# Patient Record
Sex: Female | Born: 1985
Health system: Southern US, Community
[De-identification: ages and names within clinical notes are randomized; demographics above are authoritative.]

---

## 2015-10-25 IMAGING — DX DG LUMBAR SPINE 2-3V
2 series · 2 of 2 positions shown · non-contrast
Comparison: None.

CLINICAL DATA: Lumbago

EXAM:
LUMBAR SPINE - 2-3 VIEW

[l-spine ap]
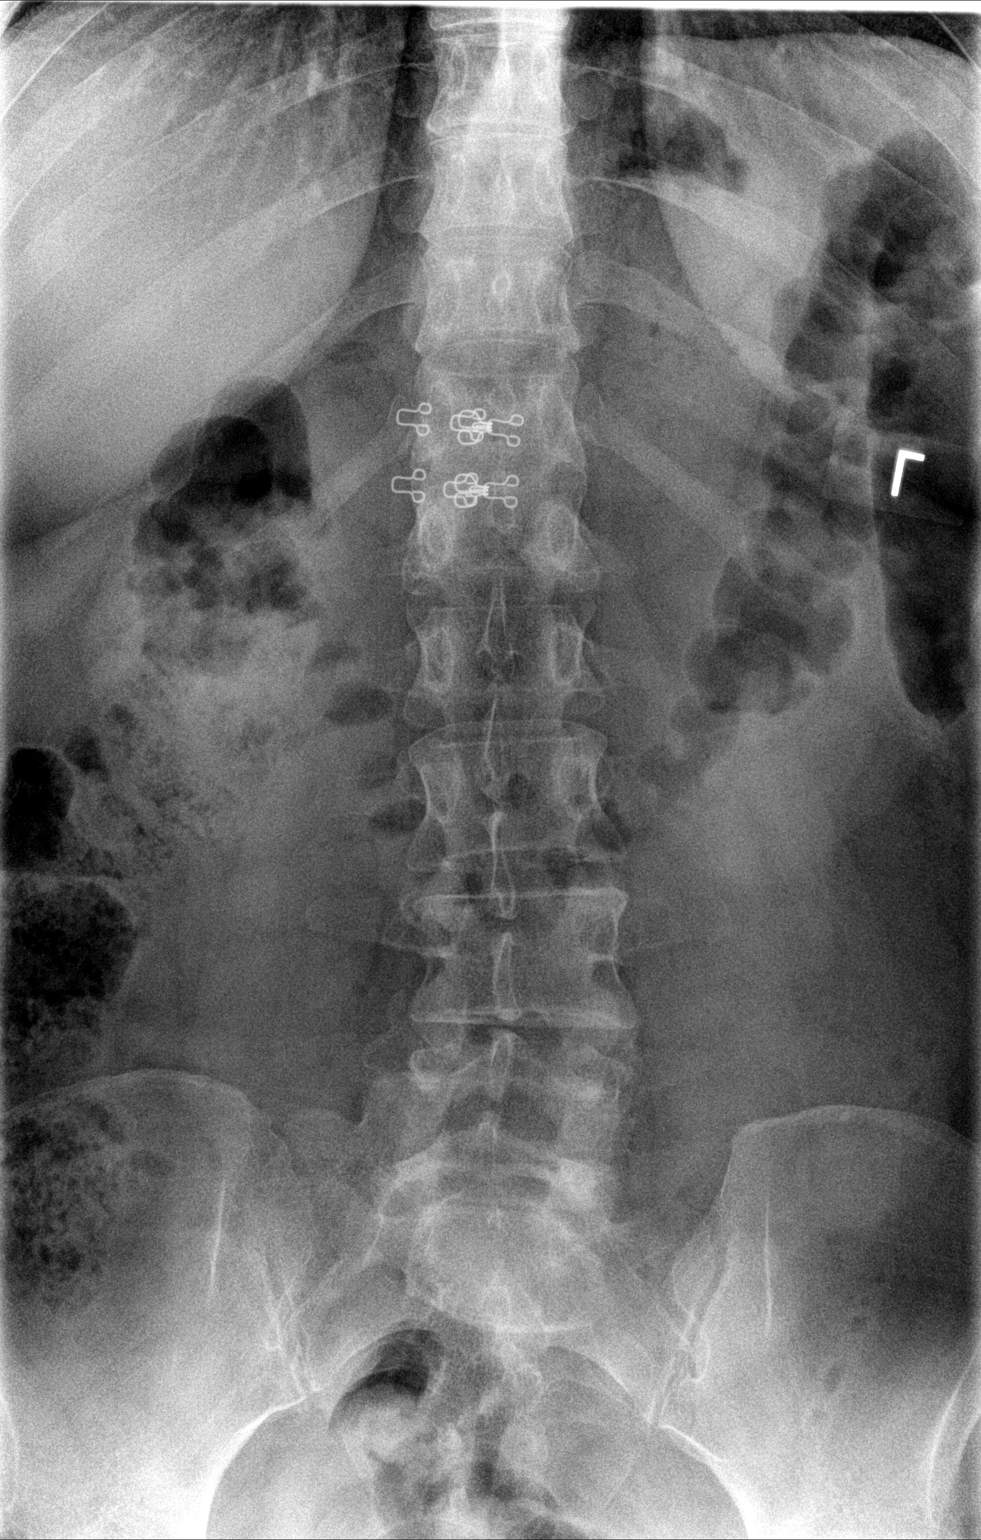

[l-spine lat]
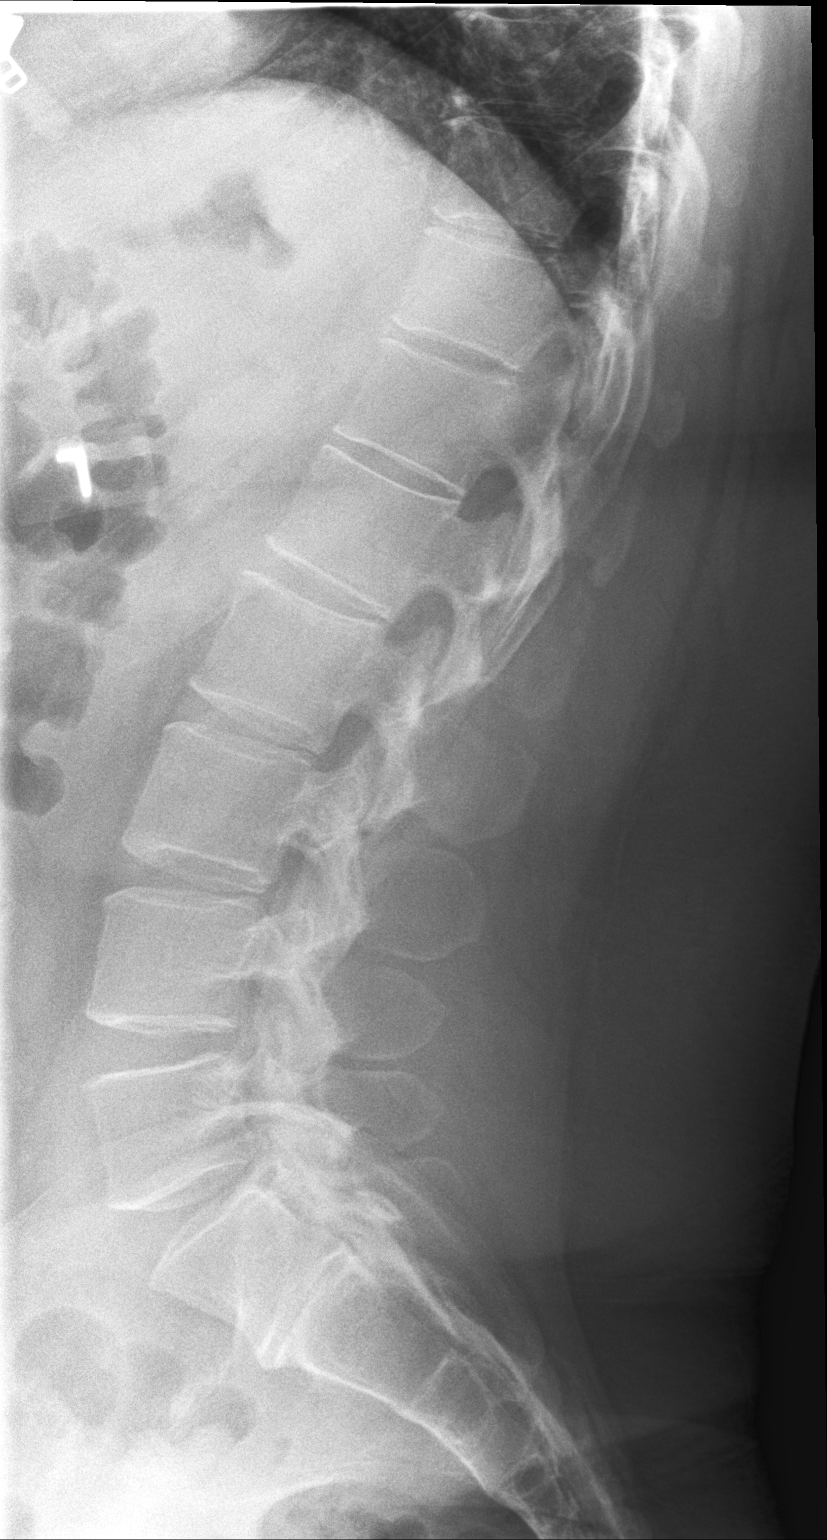

[2 of 2 positions shown; findings below may reference images not displayed]

FINDINGS: Frontal and lateral views were obtained. There are 5 non-rib-bearing
lumbar type vertebral bodies. There is lower lumbar levoscoliosis.
There is no fracture or spondylolisthesis. The disc spaces appear
unremarkable. No erosive change.
IMPRESSION: Lower lumbar levoscoliosis. No fracture or spondylolisthesis. No
appreciable disc space narrowing.

## 2017-08-25 MED FILL — SPIRONOLACTONE 50 MG TAB: 50 | 30 days supply | Qty: 30 | Fill #0

## 2017-08-25 MED FILL — ATORVASTATIN 40 MG TABLET: 40 | 90 days supply | Qty: 90 | Fill #0

## 2017-08-25 MED FILL — PANTOPRAZOLE SOD DR 40 MG T: 40 | 90 days supply | Qty: 90 | Fill #0

## 2017-09-29 MED FILL — SPIRONOLACTONE 50 MG TABLET: 50 | 30 days supply | Qty: 30 | Fill #1

## 2018-01-11 MED FILL — PANTOPRAZOLE SOD DR 40 MG T: 40 | 60 days supply | Qty: 60 | Fill #1

## 2018-01-11 MED FILL — ATORVASTATIN 40 MG TABLET: 40 | 90 days supply | Qty: 90 | Fill #1

## 2018-01-11 MED FILL — SPIRONOLACTONE 50 MG TABS: 50 | 30 days supply | Qty: 30 | Fill #2

## 2018-05-30 MED FILL — SPIRONOLACTONE 100 MG TAB: 100 | 90 days supply | Qty: 90 | Fill #0

## 2018-05-30 MED FILL — PANTOPRAZOLE SOD DR 40 MG T: 40 | 90 days supply | Qty: 90 | Fill #0

## 2018-05-30 MED FILL — TOPIRAMATE 50 MG TABLET: 50 | 41 days supply | Qty: 120 | Fill #0

## 2018-07-18 MED FILL — TOPIRAMATE 200 MG TABLET: 200 | 90 days supply | Qty: 90 | Fill #0

## 2018-07-27 MED FILL — TOPIRAMATE 200 MG TABLET: 200 | 90 days supply | Qty: 90 | Fill #0

## 2018-11-09 MED FILL — AMOXICILLIN 500 MG CAPSULE: 500 | 10 days supply | Qty: 20 | Fill #0

## 2018-11-09 MED FILL — FLUTICASONE PROP 50 MCG SPR: 50 | 30 days supply | Qty: 16 | Fill #0

## 2019-10-14 IMAGING — DX DG CHEST 2V
3 series · 3 of 3 positions shown · non-contrast
Comparison: None.

CLINICAL DATA: Pt received her second covid vaccine yesterday and
this morning began to have right groin pain. Then later began to
have chills and right leg pain. Redness noted to right lower leg
which is new.

EXAM:
CHEST - 2 VIEW

[chest lat (1 of 2)]
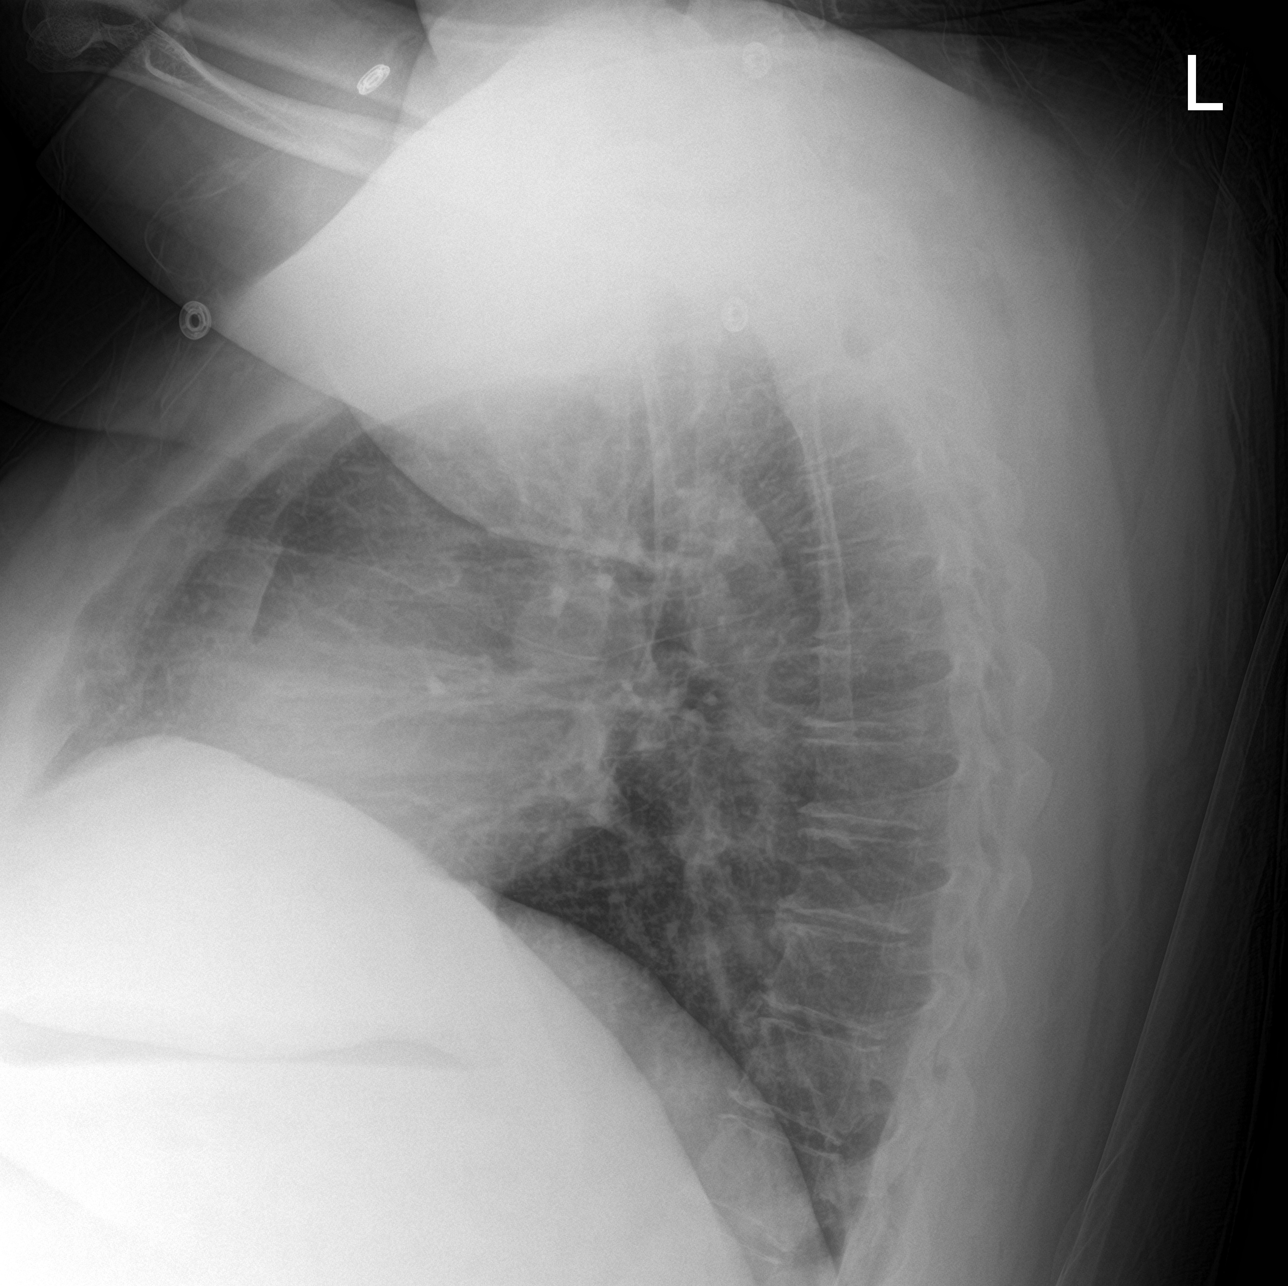

[chest ap]
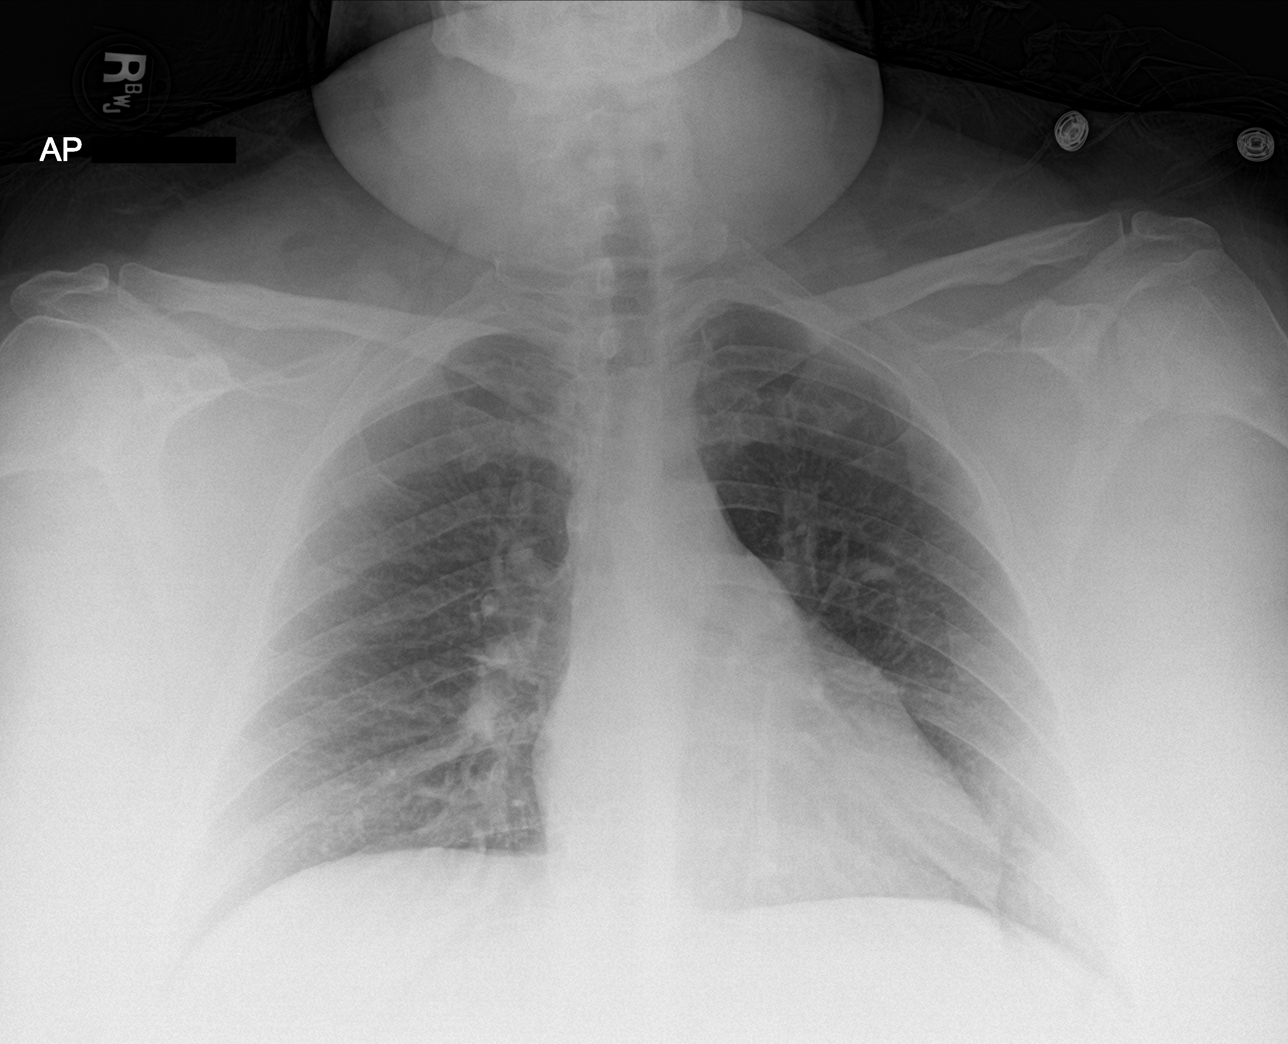

[chest lat (2 of 2)]
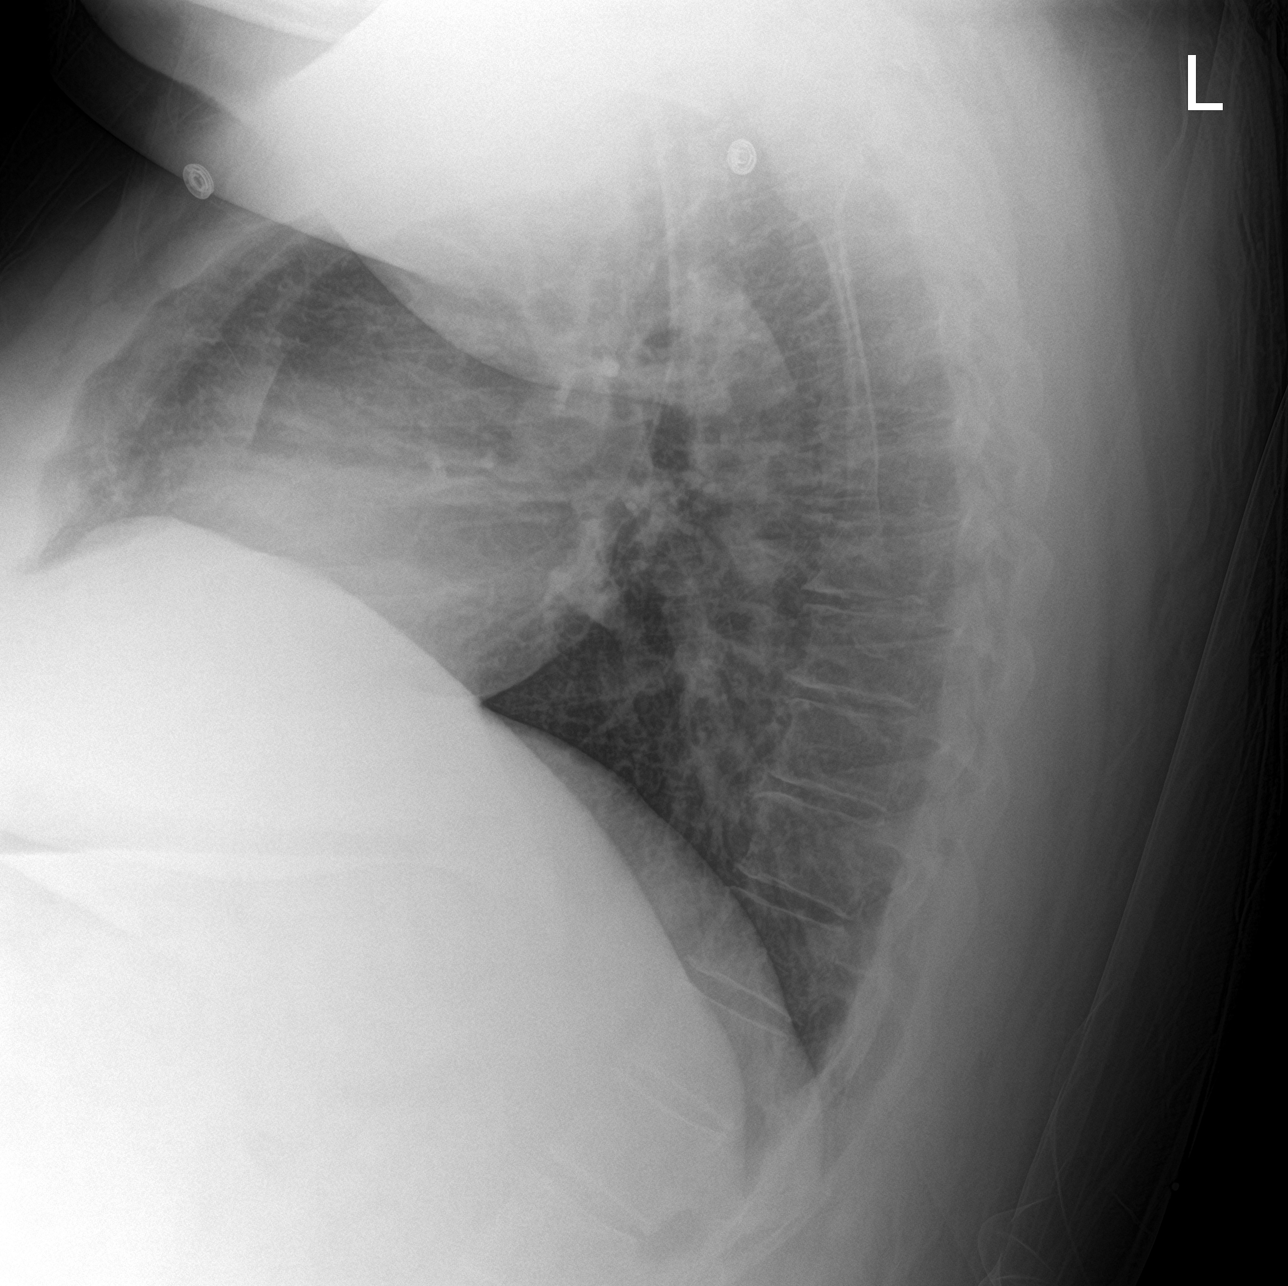

[3 of 3 positions shown; findings below may reference images not displayed]

FINDINGS: The cardiomediastinal contours are within normal limits. The lungs
are clear. No pneumothorax or pleural effusion. No acute finding in
the visualized skeleton.
IMPRESSION: No acute cardiopulmonary process.

## 2019-10-15 IMAGING — US US ABDOMEN COMPLETE
1 series · 14 of 25 positions shown · non-contrast
Comparison: None.

CLINICAL DATA: Transaminitis

EXAM:
ABDOMEN ULTRASOUND COMPLETE

[Series 1: us abdomen complete · 14 of 85 slices shown]
[im 1/85]
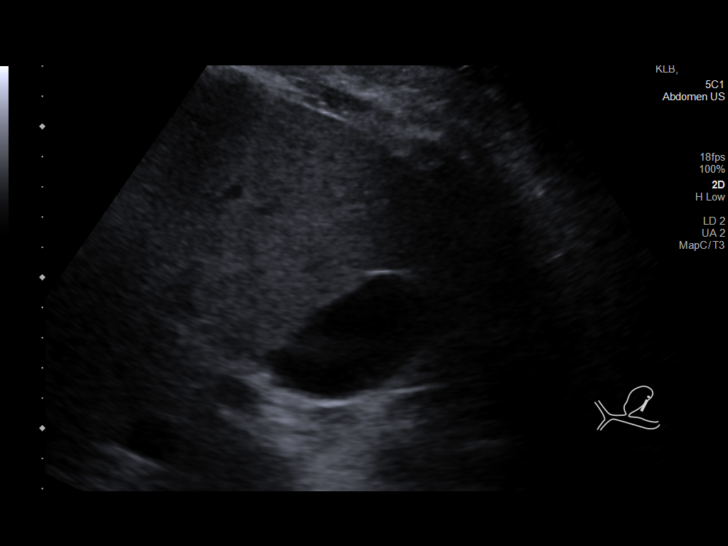
[im 8/85]
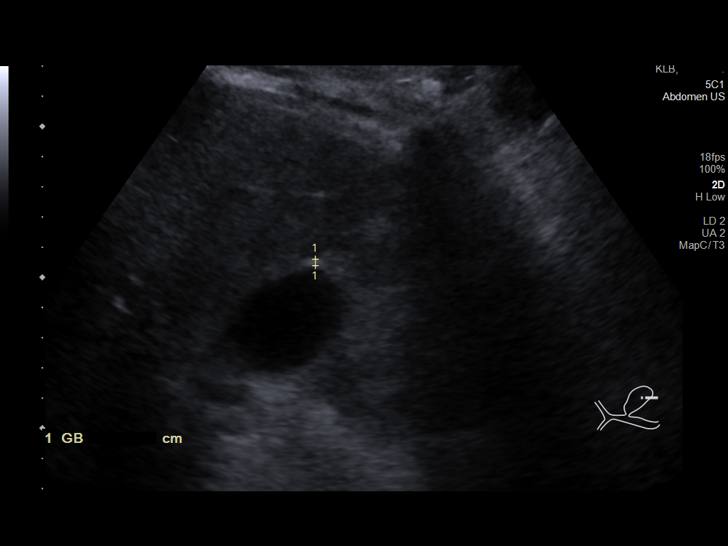
[im 15/85]
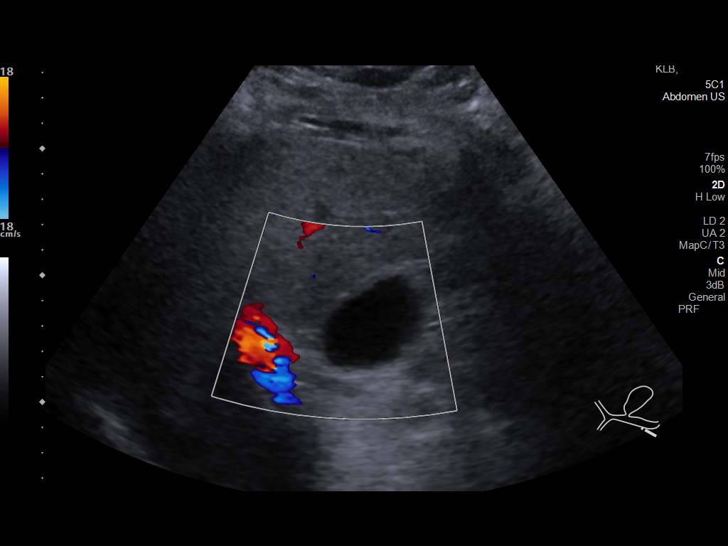
[im 22/85]
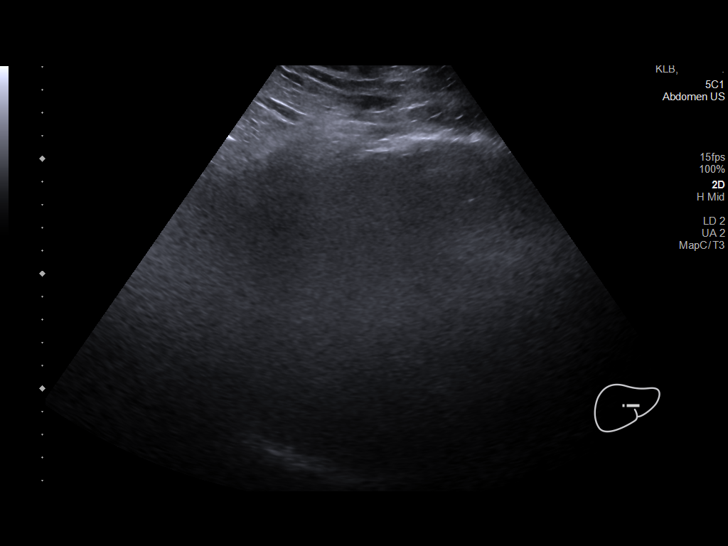
[im 29/85]
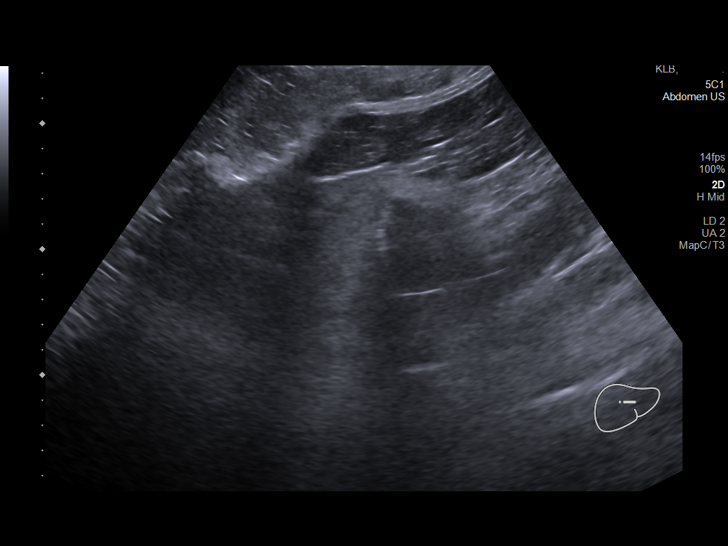
[im 32/85]
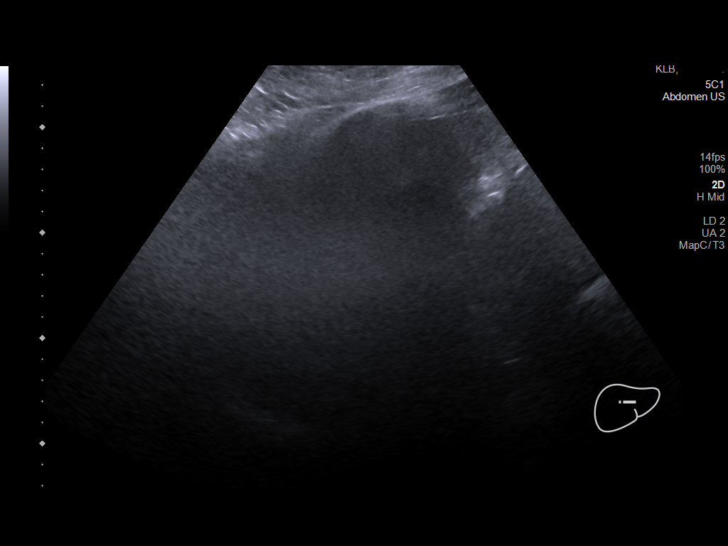
[im 39/85]
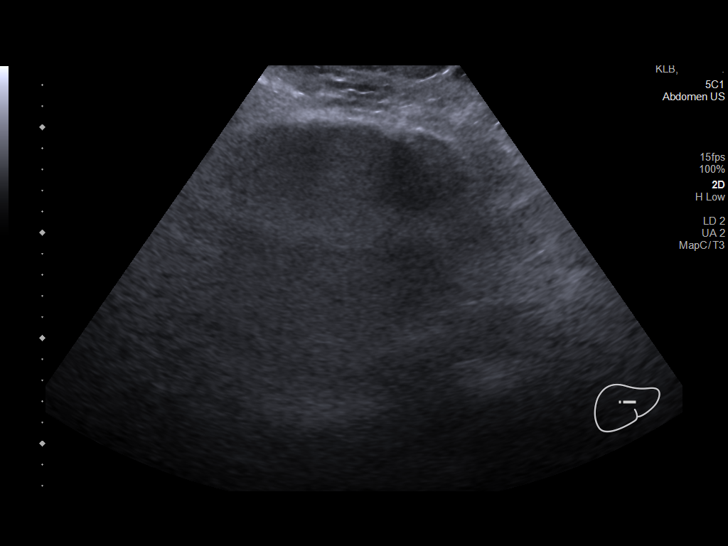
[im 46/85]
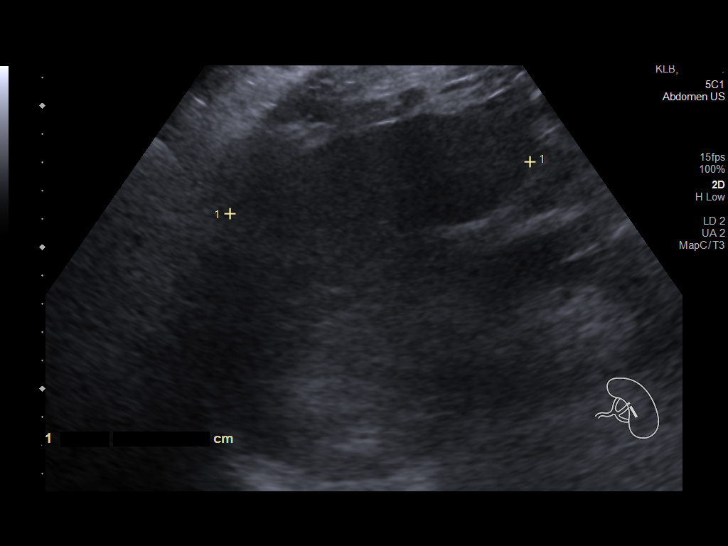
[im 53/85]
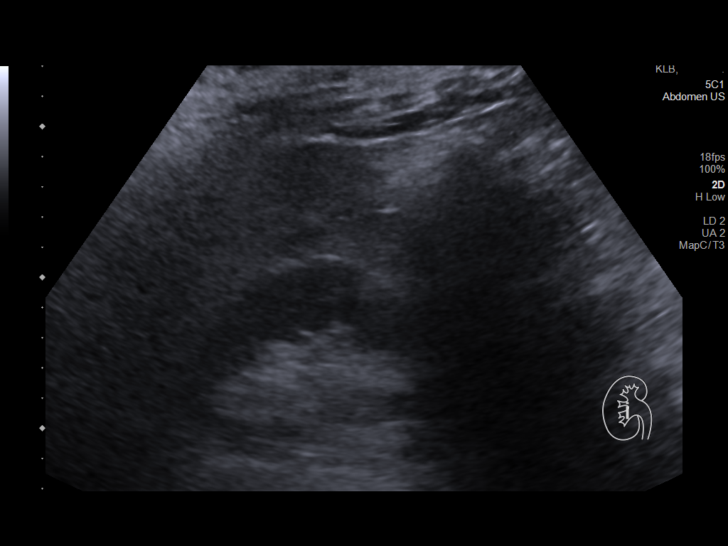
[im 57/85]
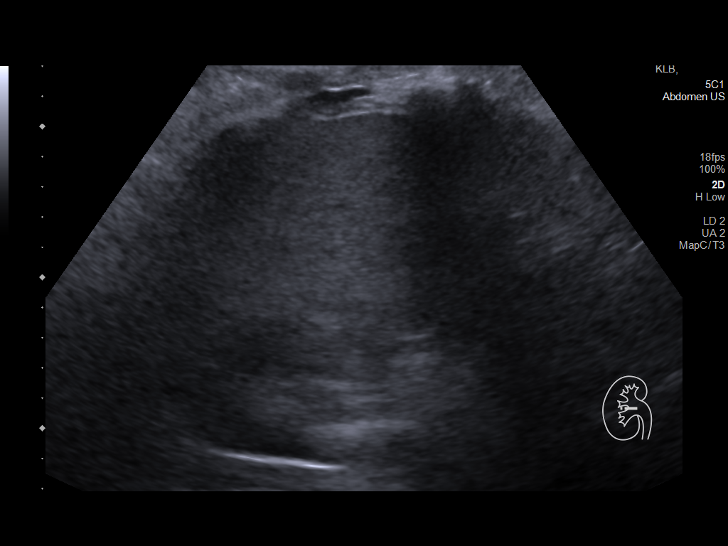
[im 64/85]
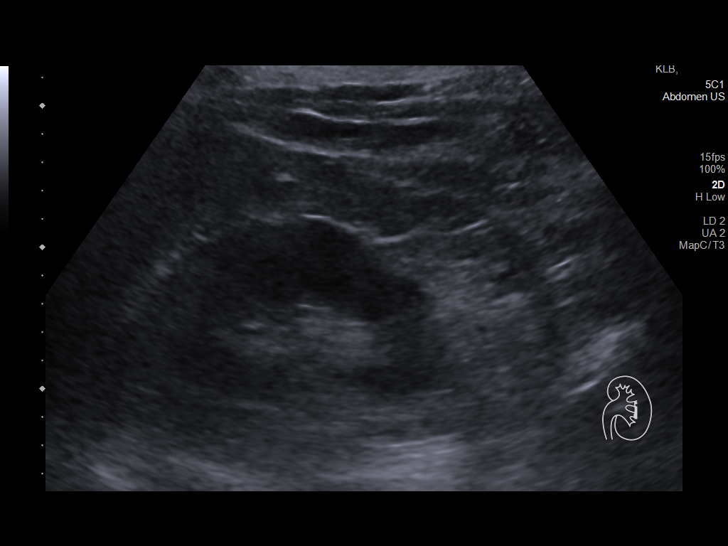
[im 71/85]
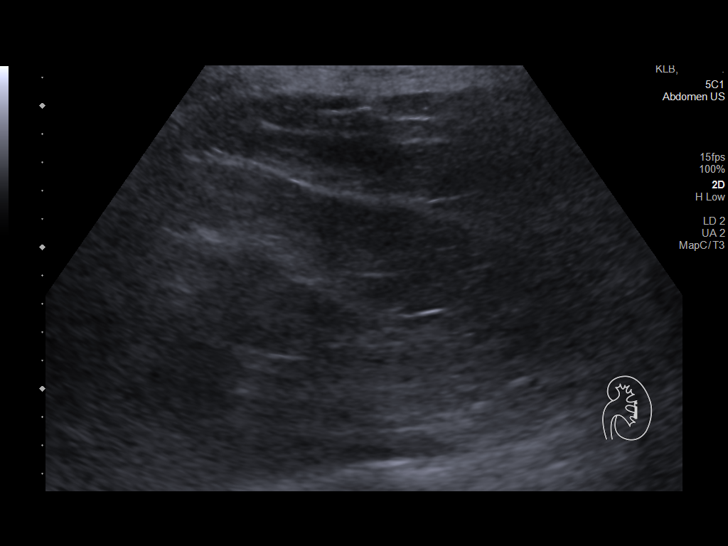
[im 78/85]
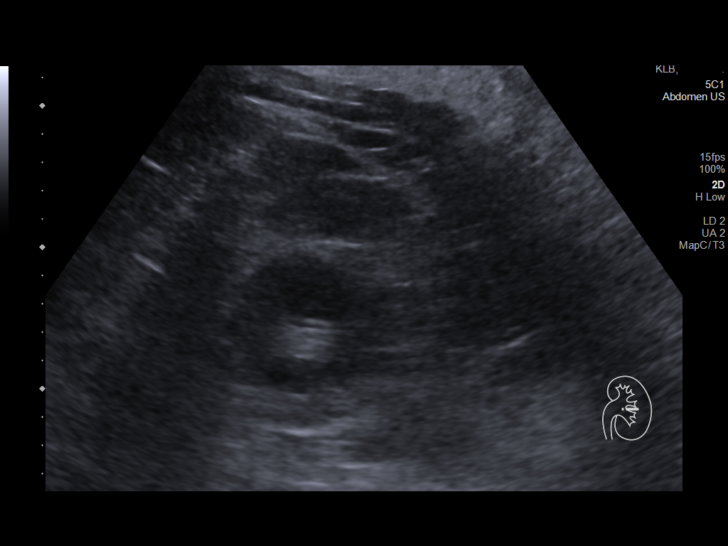
[im 85/85]
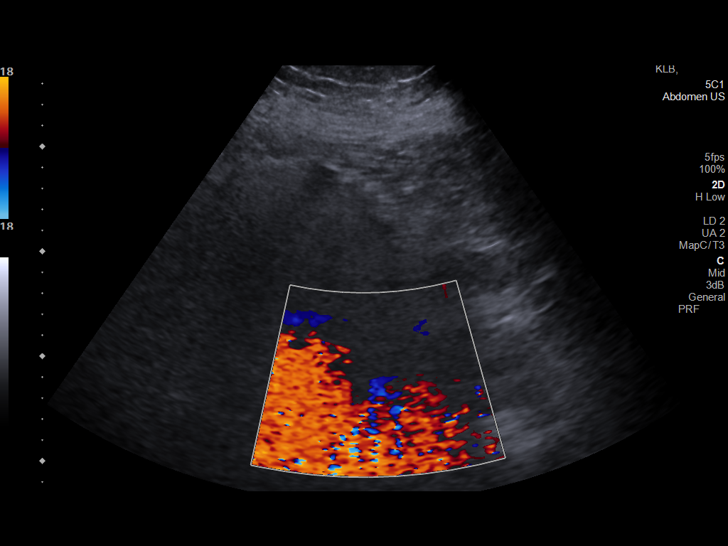

[14 of 25 positions shown; findings below may reference images not displayed]

FINDINGS: Examination is generally somewhat limited by body habitus and bowel
gas.

Gallbladder: No gallstones or wall thickening visualized. No
sonographic Murphy sign noted by sonographer.

Common bile duct: Diameter: 1 mm

Liver: No focal lesion identified. Within normal limits in
parenchymal echogenicity. Portal vein is patent on color Doppler
imaging with normal direction of blood flow towards the liver.

IVC: No abnormality visualized.

Pancreas: Obscured by overlying bowel gas.

Spleen: Size and appearance within normal limits.

Right Kidney: Length: 11.6 cm. Echogenicity within normal limits. No
mass or hydronephrosis visualized.

Left Kidney: Length: 11.1 cm. Echogenicity within normal limits. No
mass or hydronephrosis visualized.

Abdominal aorta: No aneurysm visualized.

Other findings: None.
IMPRESSION: No ultrasound abnormality to explain transaminitis. Examination is
generally somewhat limited by body habitus and bowel gas. Consider
CT or MRI to further evaluate.

## 2019-10-15 IMAGING — US US EXTREM LOW VENOUS*R*
1 series · 14 of 24 positions shown · non-contrast
Comparison: None.

CLINICAL DATA: RIGHT leg pain

EXAM:
RIGHT LOWER EXTREMITY VENOUS DOPPLER ULTRASOUND
TECHNIQUE: Gray-scale sonography with compression, as well as color and duplex
ultrasound, were performed to evaluate the deep venous system(s)
from the level of the common femoral vein through the popliteal and
proximal calf veins.

[Series 1: us venous img lower uni right (dvt) · portal-venous · 14 of 33 slices shown]
[im 1/33]
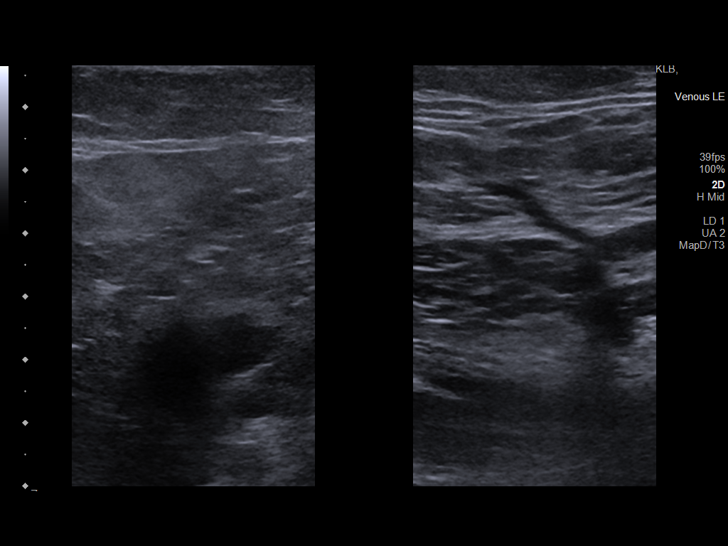
[im 3/33]
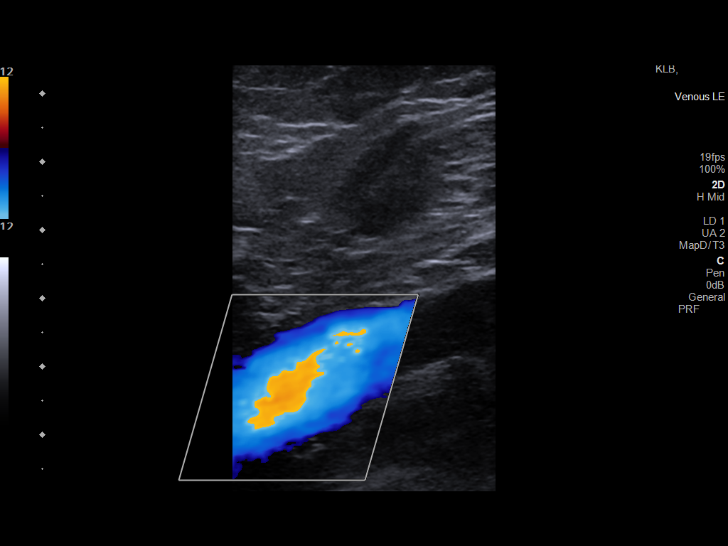
[im 6/33]
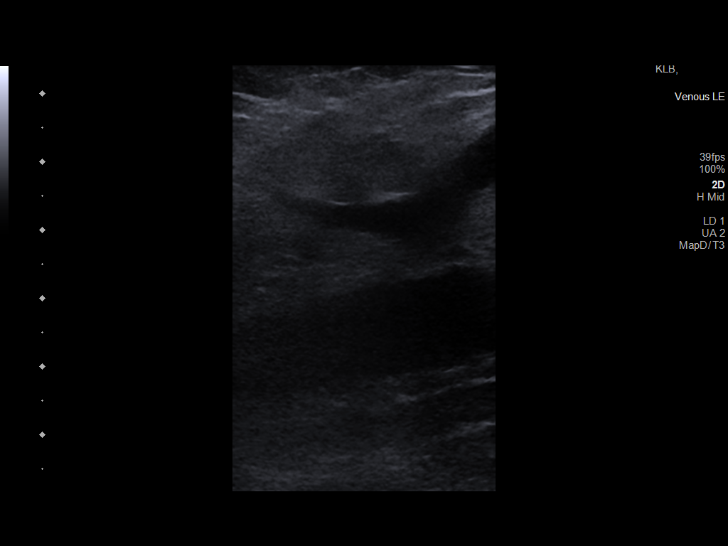
[im 9/33]
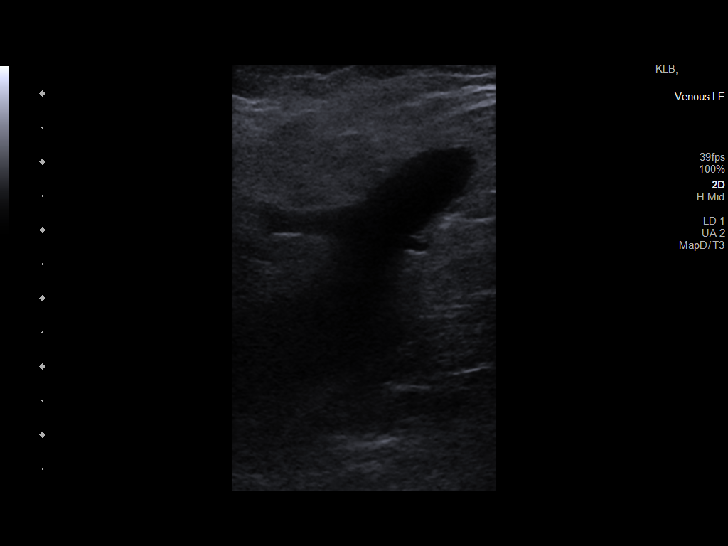
[im 10/33]
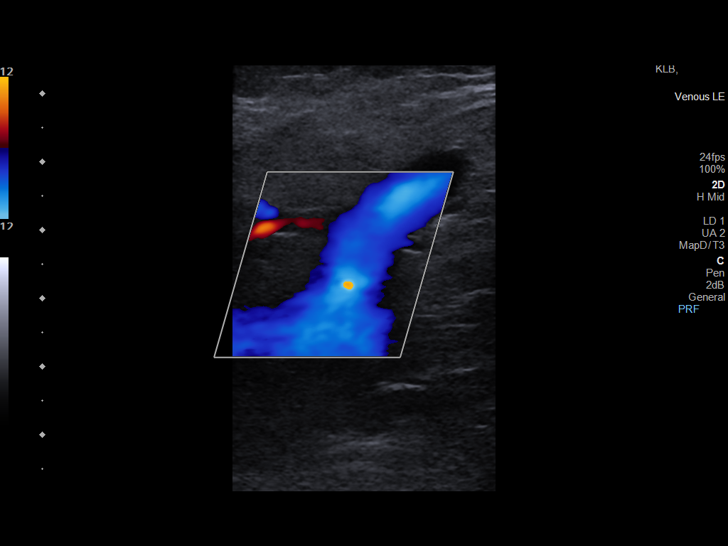
[im 13/33]
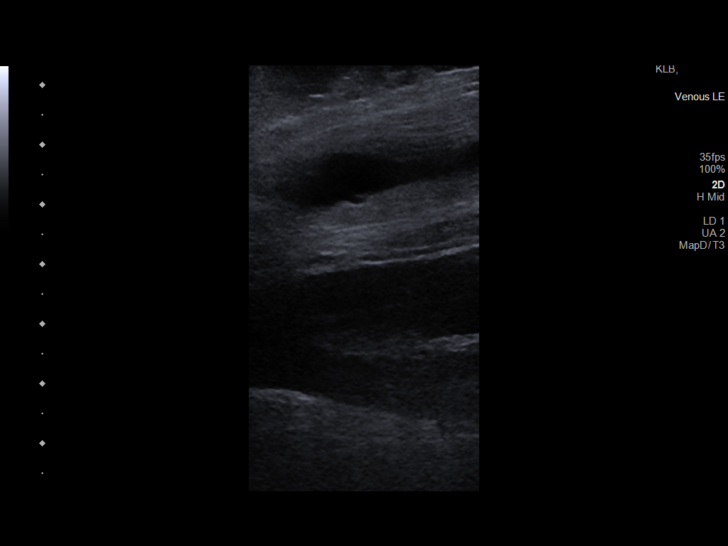
[im 16/33]
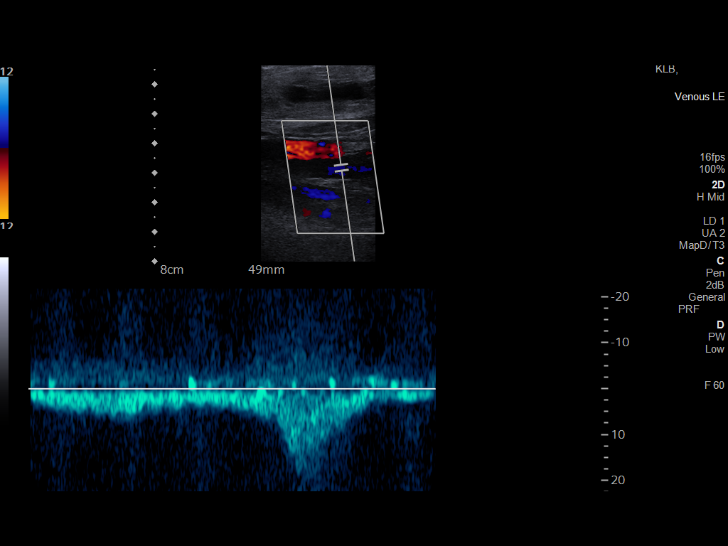
[im 17/33]
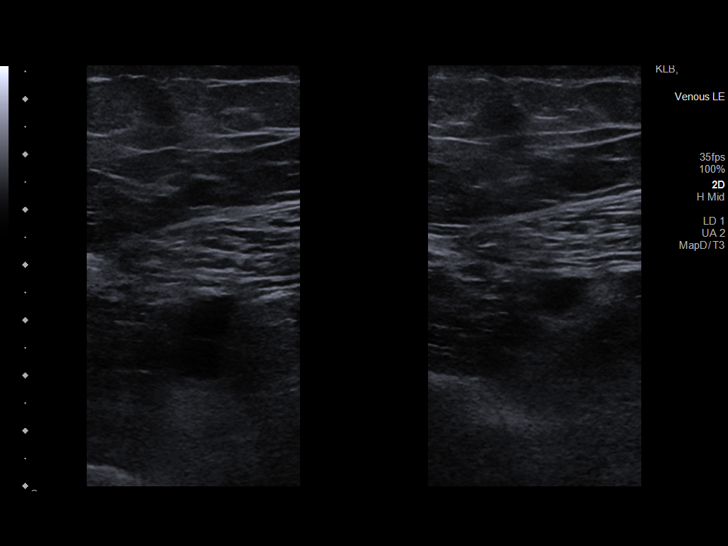
[im 20/33]
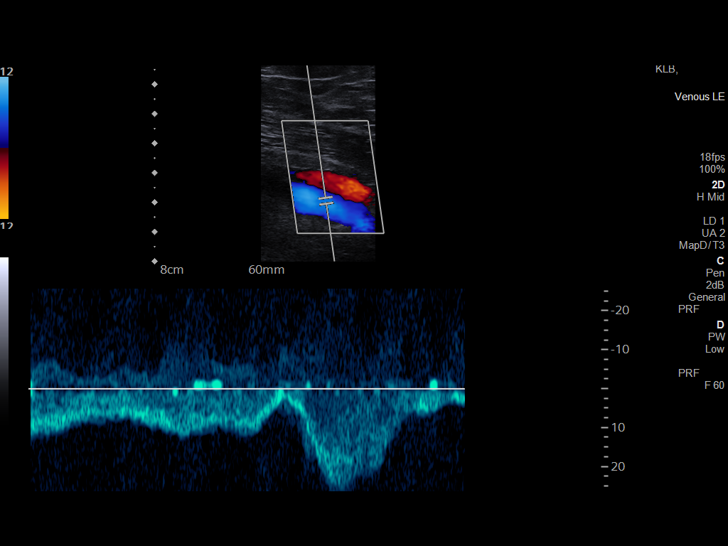
[im 23/33]
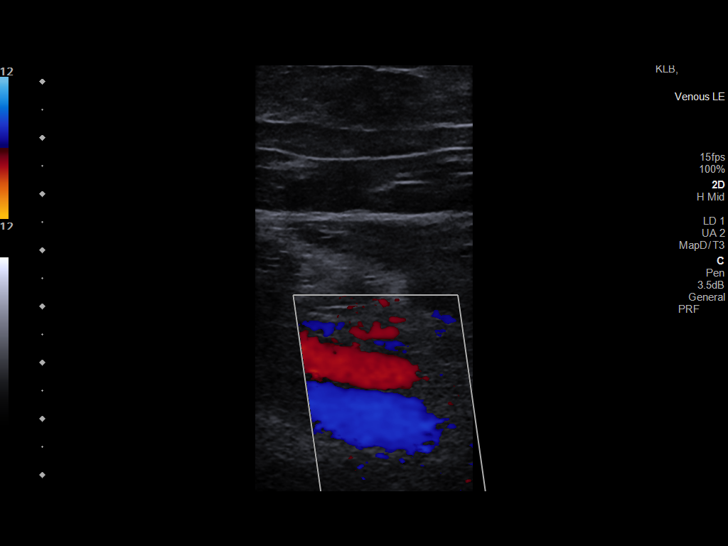
[im 26/33]
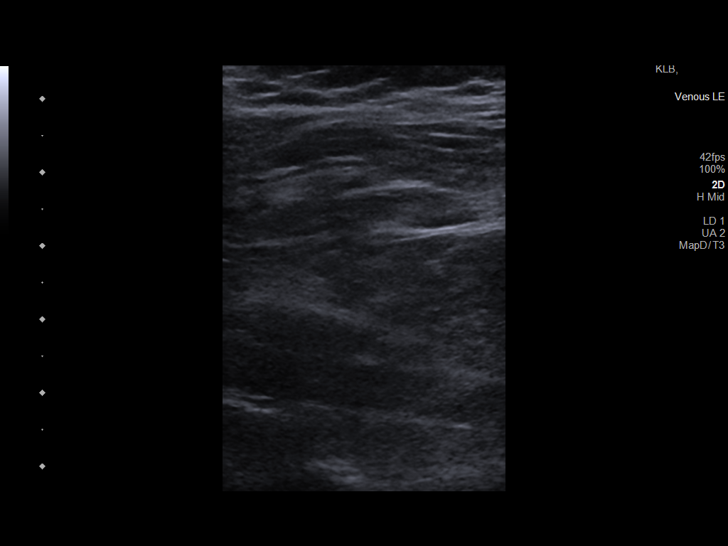
[im 27/33]
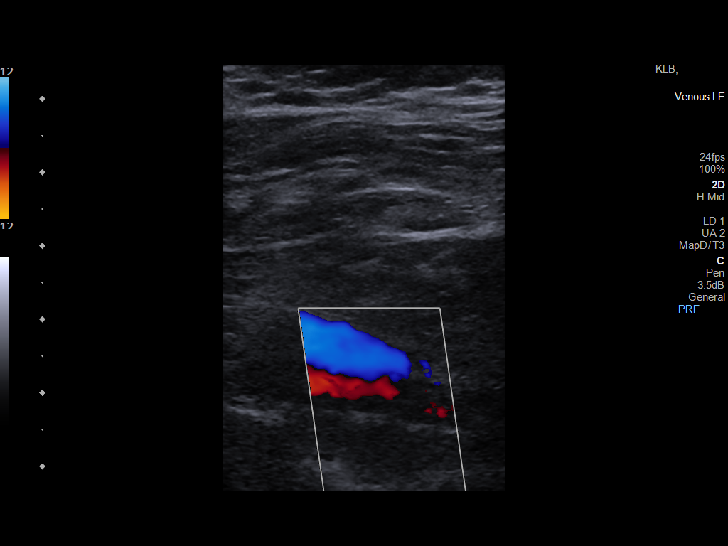
[im 30/33]
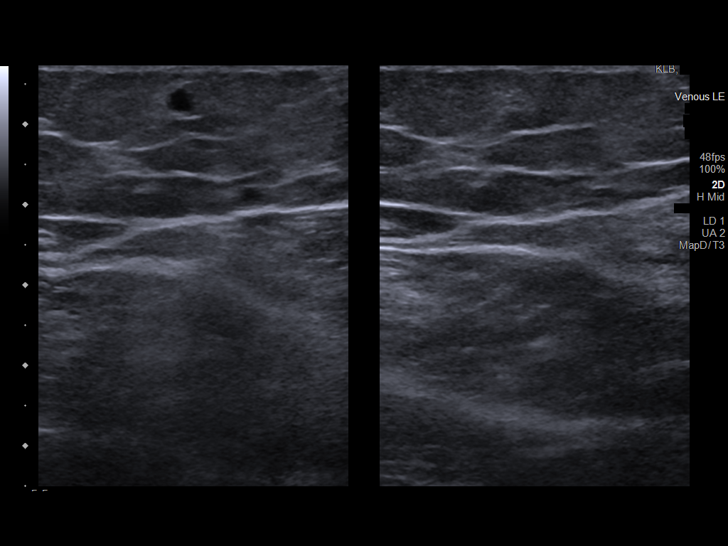
[im 33/33]
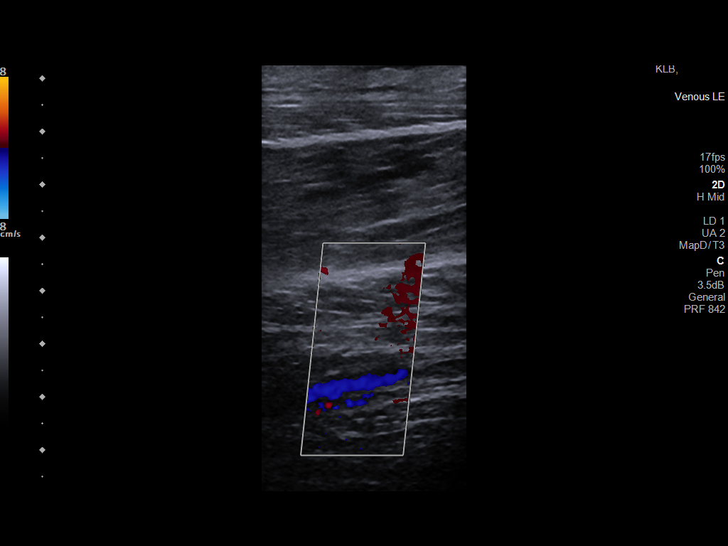

[14 of 24 positions shown; findings below may reference images not displayed]

FINDINGS: VENOUS

Normal compressibility of the common femoral, superficial femoral,
and popliteal veins, as well as the visualized calf veins.
Visualized portions of profunda femoral vein and great saphenous
vein unremarkable. No filling defects to suggest DVT on grayscale or
color Doppler imaging. Doppler waveforms show normal direction of
venous flow, normal respiratory plasticity and response to
augmentation.

Limited views of the contralateral common femoral vein are
unremarkable.

OTHER

None.

Limitations: none
IMPRESSION: No acute DVT.

## 2019-10-26 MED FILL — FLUCONAZOLE 150 MG TABS: 150 | 1 days supply | Qty: 1 | Fill #0

## 2019-10-26 MED FILL — TOPIRAMATE 25 MG TAB: 25 | 35 days supply | Qty: 105 | Fill #0

## 2019-10-26 MED FILL — PANTOPRAZOLE SOD DR 40 MG T: 40 | 90 days supply | Qty: 180 | Fill #0

## 2019-10-26 MED FILL — CEPHALEXIN 500 MG CAPSULE: 500 | 7 days supply | Qty: 28 | Fill #0

## 2019-10-30 MED FILL — SPIRONOLACTONE 25 MG TABS: 25 | 90 days supply | Qty: 90 | Fill #0

## 2019-11-08 MED FILL — SPIRONOLACTONE 25 MG TABS: 25 | 90 days supply | Qty: 90 | Fill #0

## 2021-05-23 IMAGING — MR MR LUMBAR SPINE W/O CM
5 series · 31 of 48 positions shown · non-contrast
Comparison: Lumbar spine radiographs [DATE]

CLINICAL DATA: Left leg pain and numbness for 2 days

EXAM:
MRI LUMBAR SPINE WITHOUT CONTRAST
TECHNIQUE: Multiplanar, multisequence MR imaging of the lumbar spine was
performed. No intravenous contrast was administered.

[Series 5: T1 · sagittal · 4.0mm · 0.81mm/px · 6 of 15 slices shown (1 of 2)]
[im 1/15]
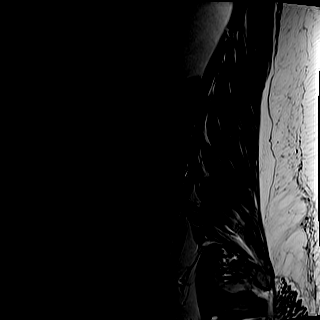
[im 3/15]
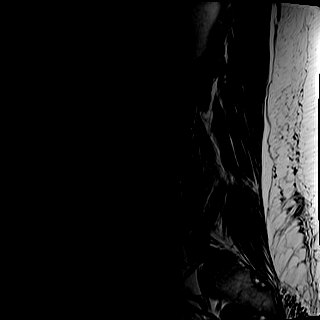
[im 6/15]
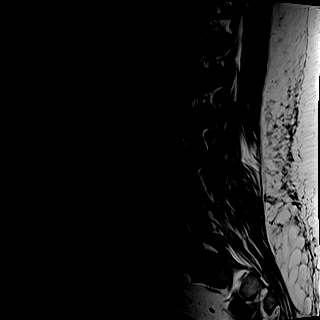
[im 9/15]
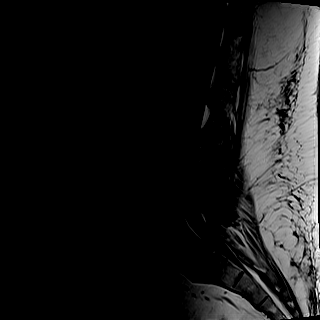
[im 12/15]
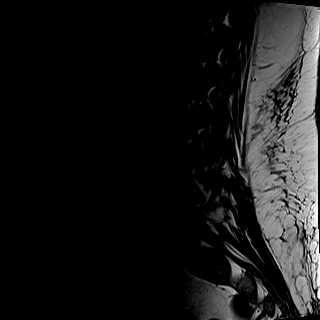
[im 15/15]
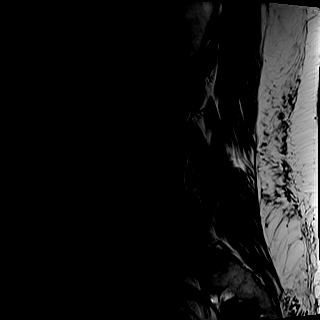

[Series 6: STIR · sagittal · 4.0mm · 0.51mm/px · 1 of 15 slices shown]
[im 1/15]
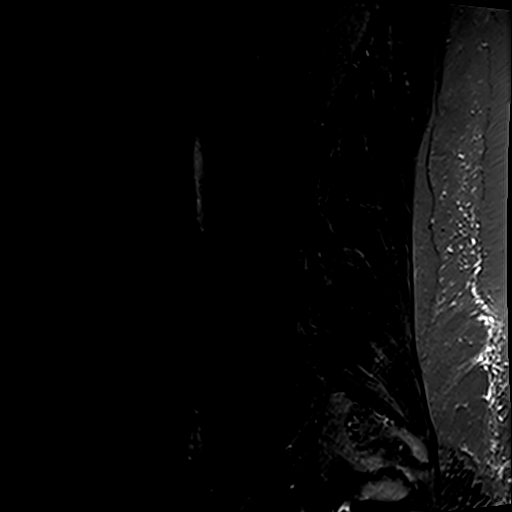

[Series 7: T2 · sagittal · 4.0mm · 0.68mm/px · 6 of 15 slices shown (1 of 2)]
[im 1/15]
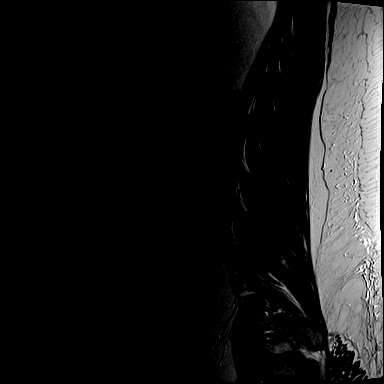
[im 3/15]
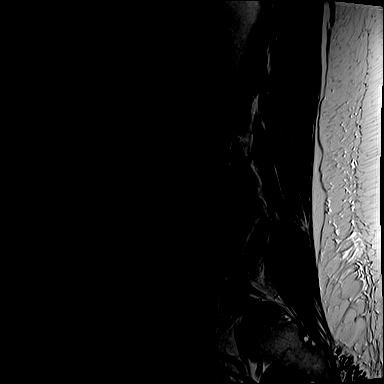
[im 6/15]
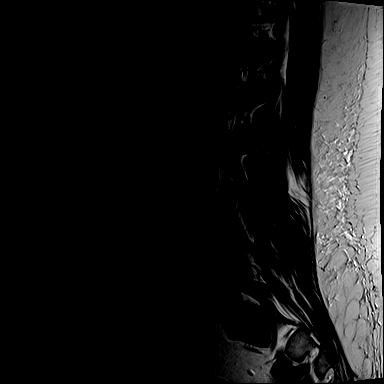
[im 9/15]
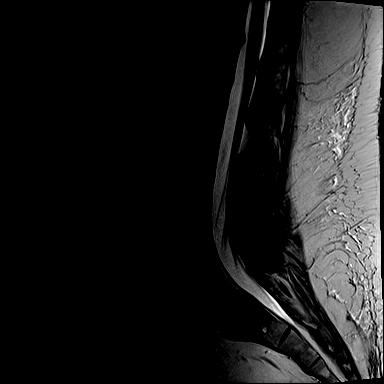
[im 12/15]
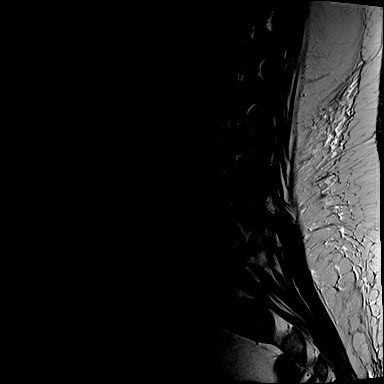
[im 15/15]
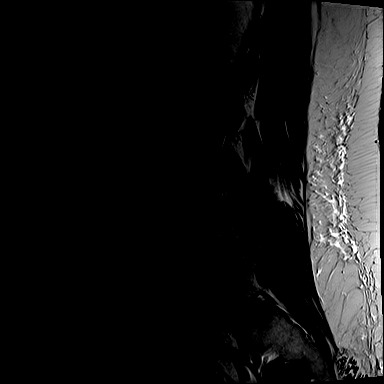

[Series 8: T2 · axial · 4.0mm · 0.70mm/px · z∈[-0,+240]mm · 9 of 40 slices shown (2 of 2)]
[im 1/40]
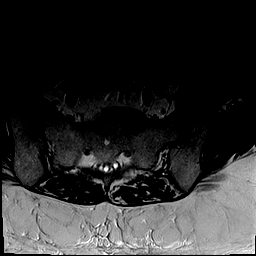
[im 6/40]
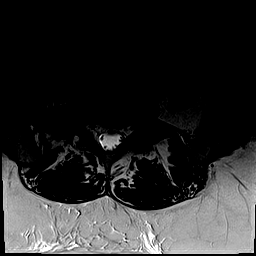
[im 12/40]
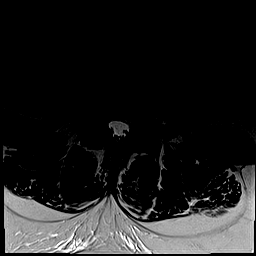
[im 17/40]
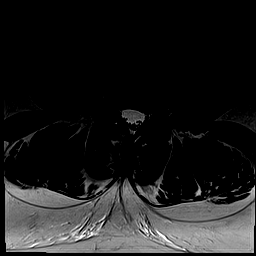
[im 20/40]
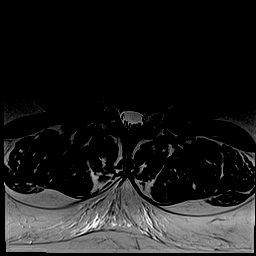
[im 23/40]
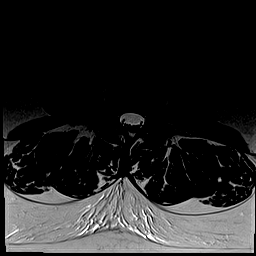
[im 28/40]
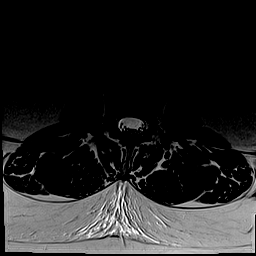
[im 34/40]
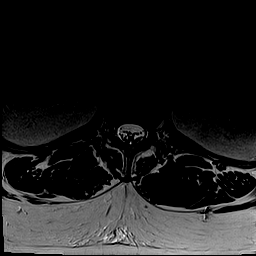
[im 40/40]
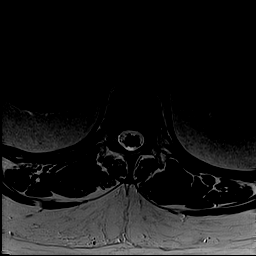

[Series 9: T1 · axial · 4.0mm · 0.35mm/px · z∈[-0,+240]mm · 9 of 40 slices shown (2 of 2)]
[im 1/40]
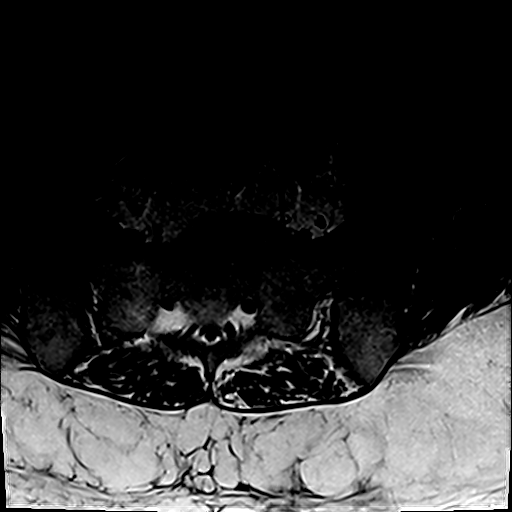
[im 6/40]
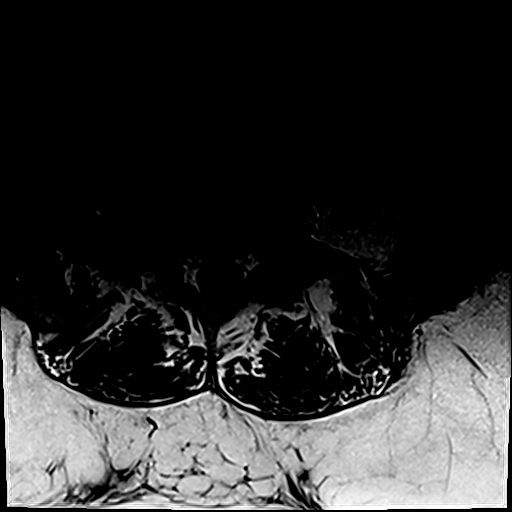
[im 12/40]
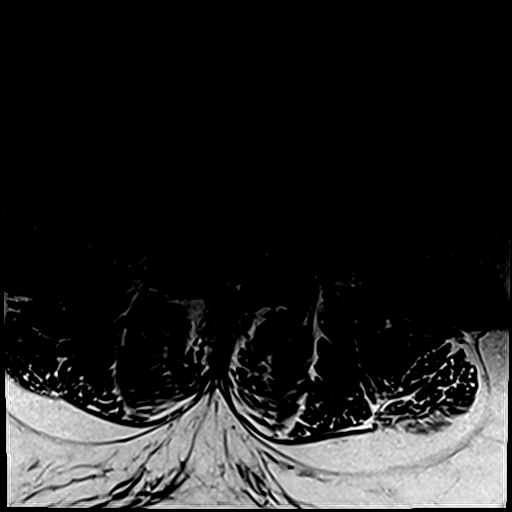
[im 17/40]
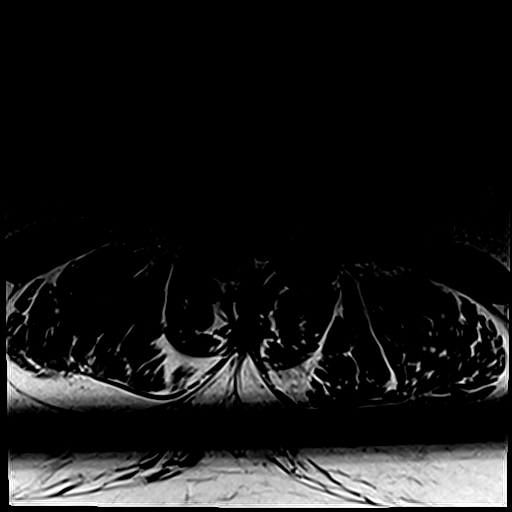
[im 20/40]
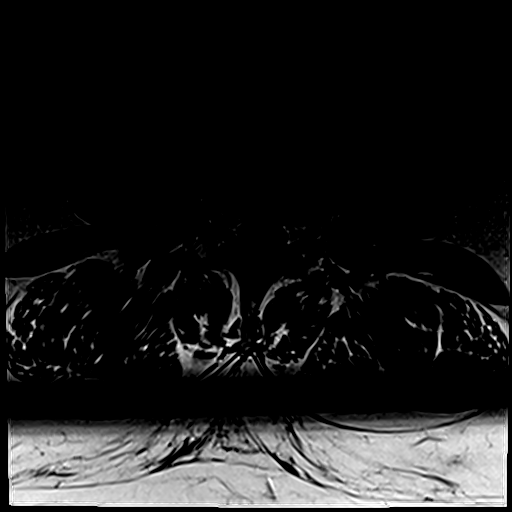
[im 23/40]
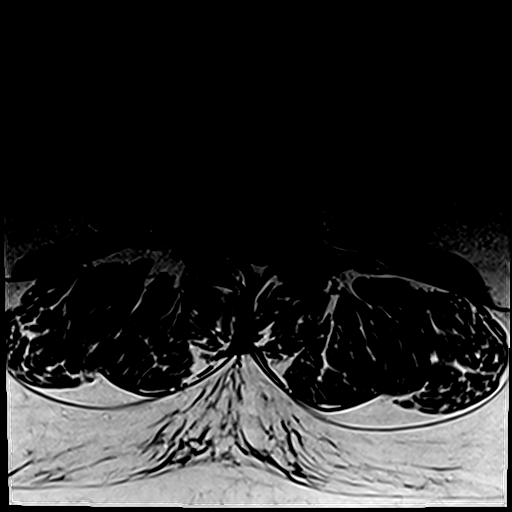
[im 28/40]
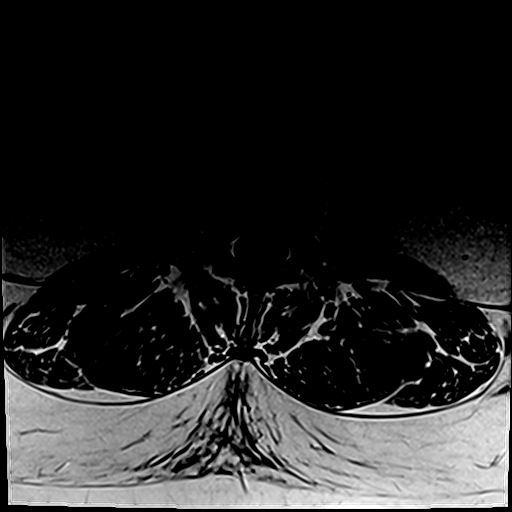
[im 34/40]
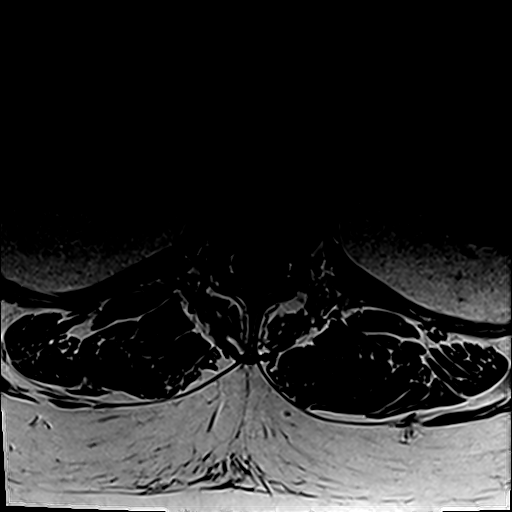
[im 40/40]
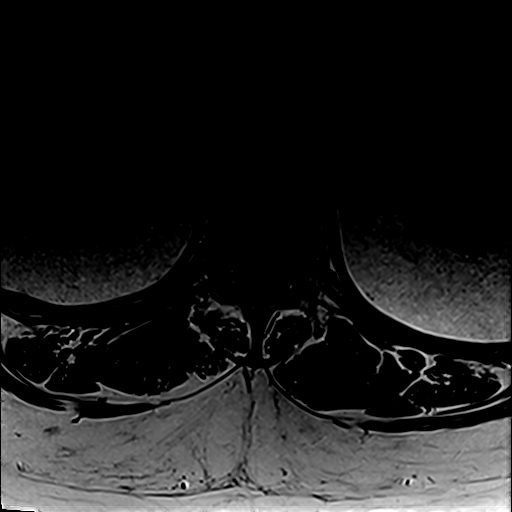

[31 of 48 positions shown; findings below may reference images not displayed]

FINDINGS: Segmentation: There is transitional anatomy with lumbarization of
the S1 vertebral body. For the purposes of this report, the lowest
fully formed disc space is designated L5-S1, with a rudimentary disc
space at S1-S2.

Alignment: There is mild levocurvature centered at L3. There is no
antero or retrolisthesis.

Vertebrae: Vertebral body heights are preserved. Marrow signal is
normal. There is no suspicious marrow signal abnormality or marrow
edema.

Conus medullaris and cauda equina: Conus extends to the L1 level.
Conus and cauda equina appear normal.

Paraspinal and other soft tissues: Unremarkable.

Disc levels:

The disc heights are preserved.

T12-L1 through L3-L4: No significant spinal canal or neural
foraminal stenosis. No significant facet arthropathy.

L4-L5: There is a minimal disc bulge and mild bilateral facet
arthropathy without significant spinal canal or neural foraminal
stenosis

L5-S1: Mild bilateral facet arthropathy without significant spinal
canal or neural foraminal stenosis.
IMPRESSION: 1. Transitional anatomy with partial lumbarization of the S1
vertebral body.
2. Minimal degenerative changes at L4-L5 and L5-S1 without
significant spinal canal or neural foraminal stenosis. No evidence
of nerve root impingement to explain the patient's left-sided
symptoms.
3. Mild levocurvature.

## 2021-06-24 IMAGING — MR MR CERVICAL SPINE W/O CM
5 series · 37 of 48 positions shown · non-contrast
Comparison: None available.

CLINICAL DATA: Initial evaluation for neck and mid back pain with
left leg numbness. History of MS.

EXAM:
MRI CERVICAL SPINE WITHOUT CONTRAST
TECHNIQUE: Multiplanar, multisequence MR imaging of the cervical spine was
performed. No intravenous contrast was administered.

[Series 5: T2 · sagittal · 3.0mm · 0.69mm/px · 7 of 15 slices shown (1 of 2)]
[im 1/15]
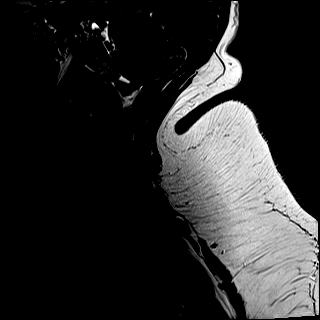
[im 3/15]
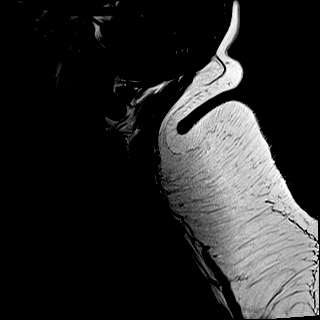
[im 5/15]
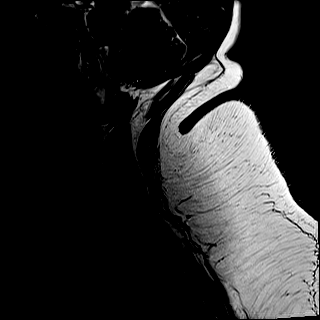
[im 8/15]
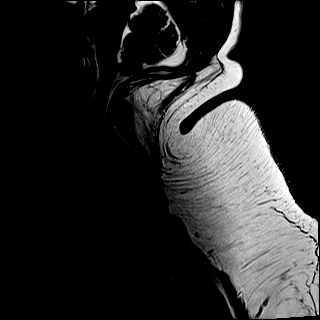
[im 10/15]
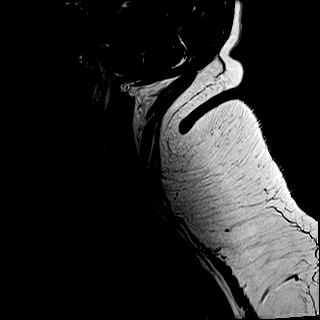
[im 12/15]
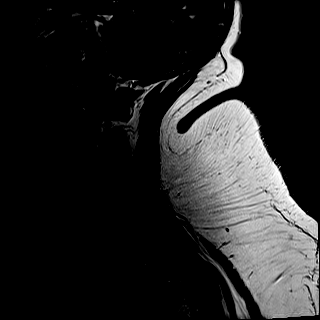
[im 15/15]
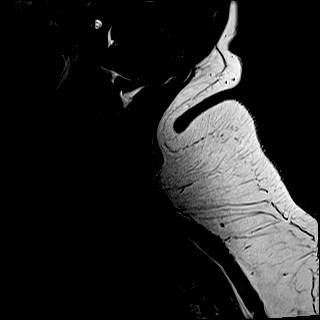

[Series 6: T1 · sagittal · 3.0mm · 0.86mm/px · 7 of 15 slices shown]
[im 1/15]
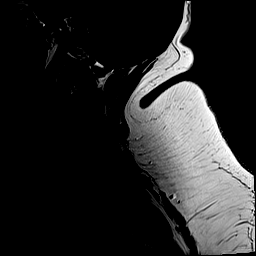
[im 3/15]
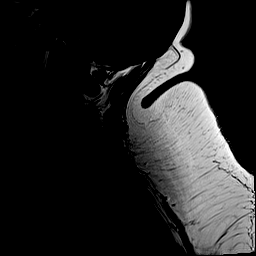
[im 5/15]
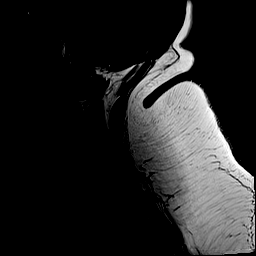
[im 8/15]
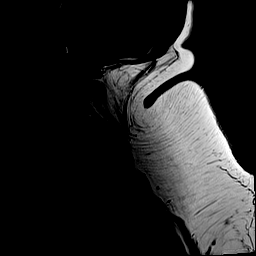
[im 10/15]
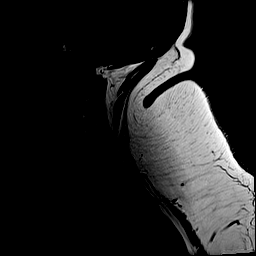
[im 12/15]
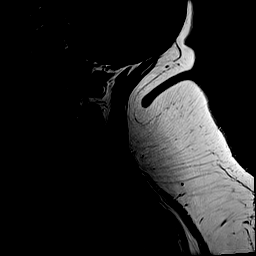
[im 15/15]
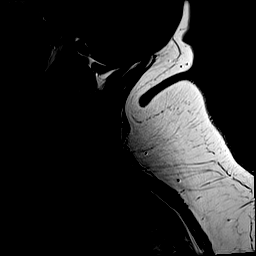

[Series 7: STIR · sagittal · 3.0mm · 0.69mm/px · 6 of 15 slices shown]
[im 1/15]
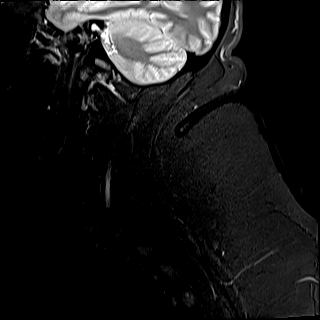
[im 3/15]
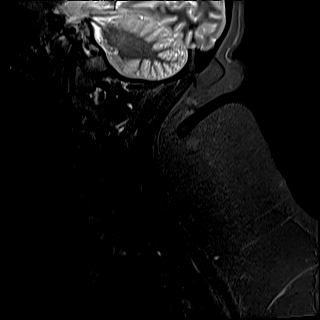
[im 6/15]
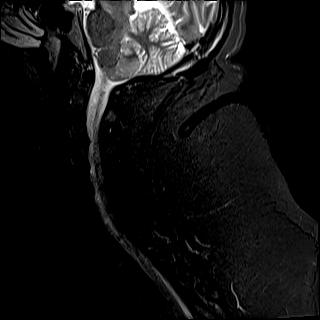
[im 9/15]
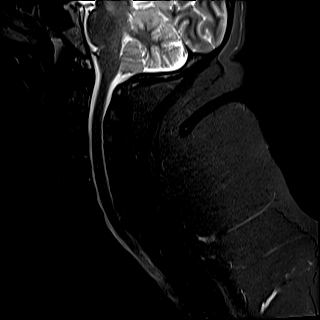
[im 12/15]
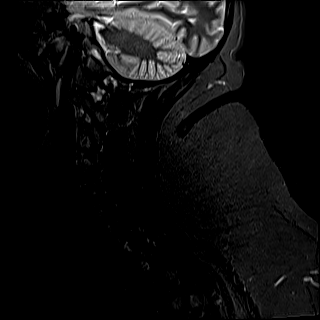
[im 15/15]
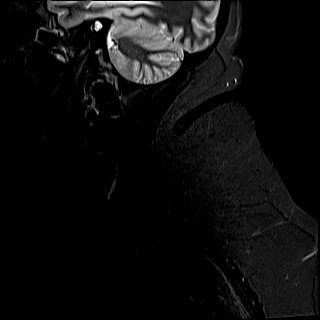

[Series 8: T2 · axial · 3.0mm · 0.70mm/px · z∈[-75,+33]mm · 9 of 33 slices shown (2 of 2)]
[im 1/33]
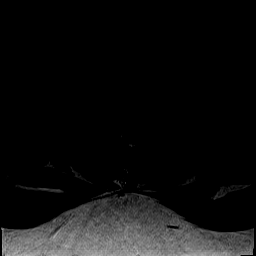
[im 3/33]
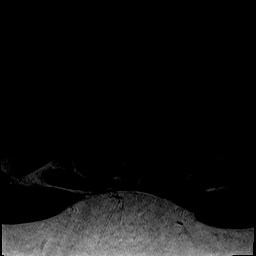
[im 5/33]
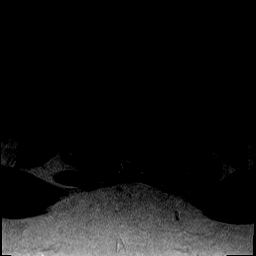
[im 10/33]
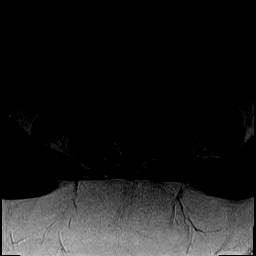
[im 15/33]
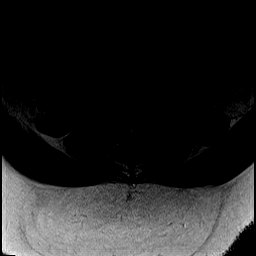
[im 18/33]
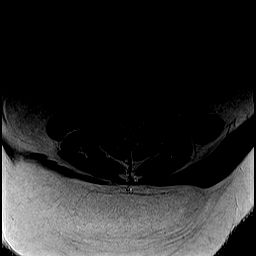
[im 23/33]
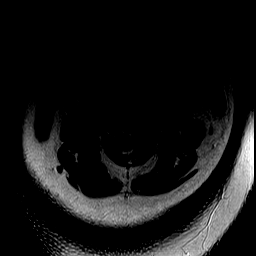
[im 28/33]
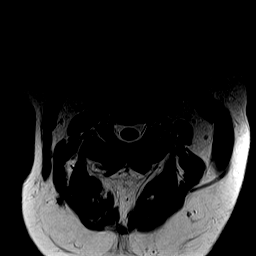
[im 33/33]
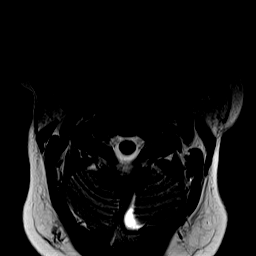

[Series 9: GRE · axial · 3.0mm · 0.35mm/px · z∈[-75,+33]mm · 8 of 33 slices shown]
[im 1/33]
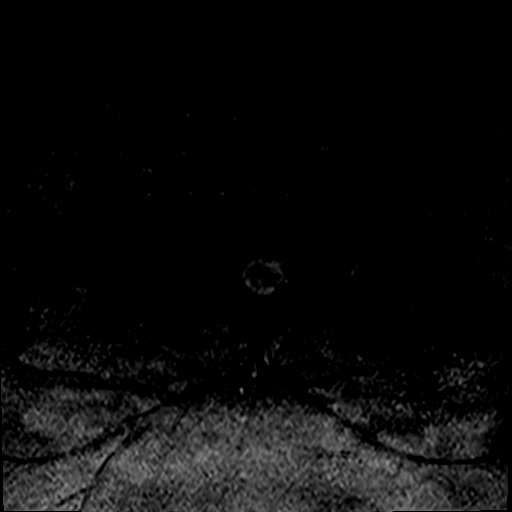
[im 5/33]
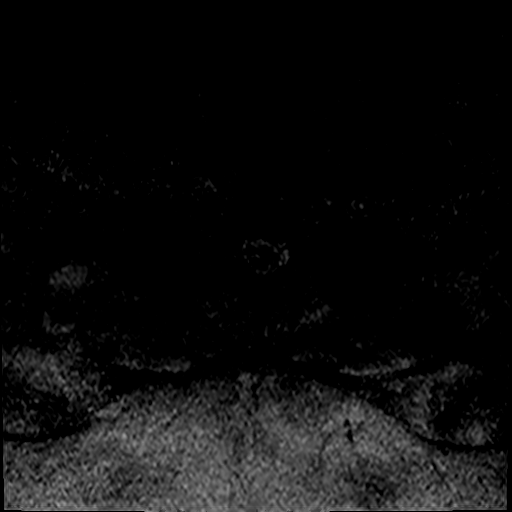
[im 10/33]
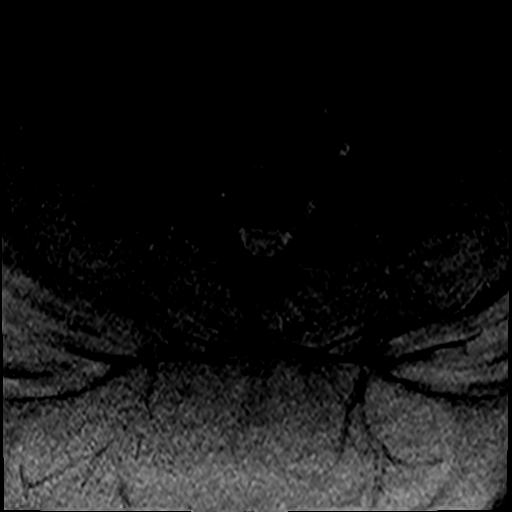
[im 15/33]
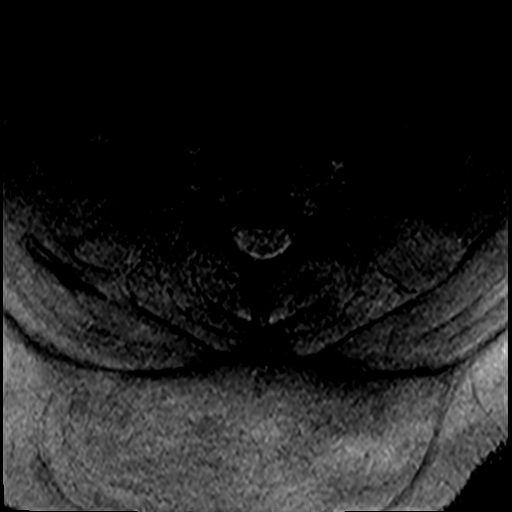
[im 18/33]
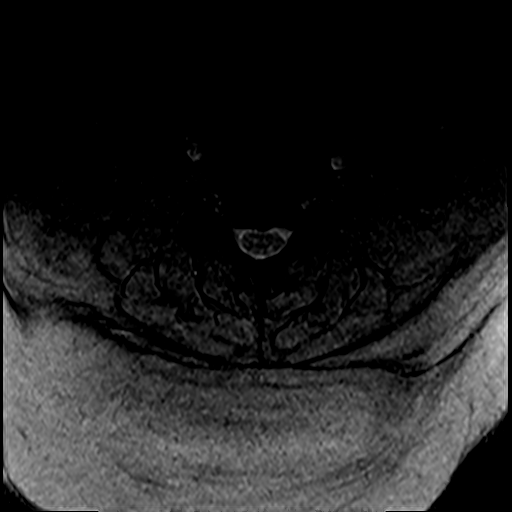
[im 23/33]
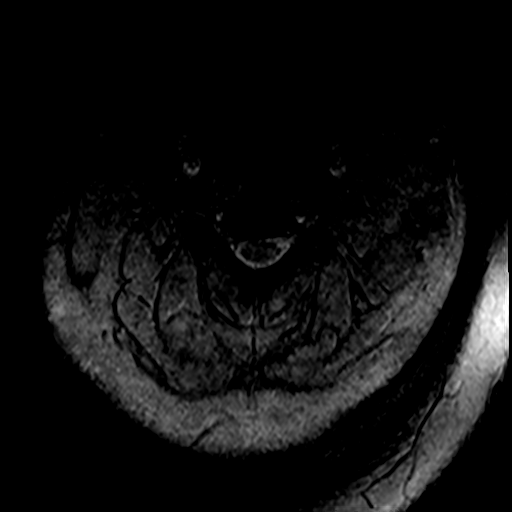
[im 28/33]
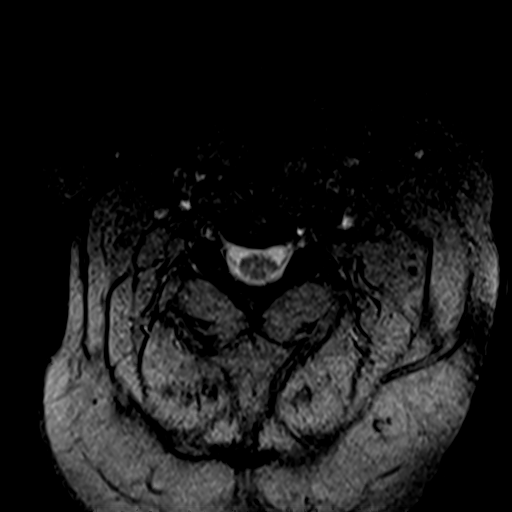
[im 33/33]
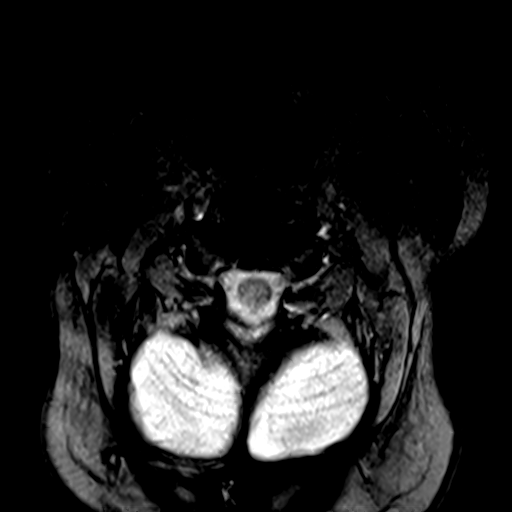

[37 of 48 positions shown; findings below may reference images not displayed]

FINDINGS: Alignment: Physiologic with preservation of the normal cervical
lordosis. No significant listhesis.

Vertebrae: Vertebral body height maintained without acute or chronic
fracture. Bone marrow signal intensity within normal limits. No
discrete or worrisome osseous lesions or abnormal marrow edema.

Cord: Normal signal and morphology. No cord signal changes to
suggest demyelinating disease.

Posterior Fossa, vertebral arteries, paraspinal tissues:
Unremarkable.

Disc levels:

C2-C3: Unremarkable.

C3-C4: Minimal right eccentric disc bulge with associated right
greater than left uncovertebral spurring. Superimposed tiny central
disc protrusion minimally indents the ventral thecal sac. No
significant spinal stenosis. Mild right C4 foraminal narrowing. Left
neural foramen remains widely patent.

C4-C5: Small central disc protrusion indents the ventral thecal sac
(series 9, image 15). No significant spinal stenosis or cord
deformity. Mild bilateral uncovertebral hypertrophy without
significant foraminal stenosis.

C5-C6: Small central to left paracentral disc protrusion mildly
indents the ventral thecal sac (series 9, image 21). Superimposed
mild endplate spurring. No significant spinal stenosis. Foramina
remain patent.

C6-C7: Shallow left paracentral disc osteophyte complex mildly
flattens the ventral thecal sac. No significant spinal stenosis or
cord deformity. Associated left-sided uncovertebral spurring with
resultant mild to moderate left C7 foraminal stenosis. Right neural
foramen remains patent.

C7-T1: Negative interspace. Mild right-sided facet hypertrophy. No
canal or foraminal stenosis.
IMPRESSION: 1. Shallow left paracentral disc osteophyte complex at C6-7 with
resultant mild to moderate left C7 foraminal stenosis.
2. Small central disc protrusions at C3-4 through C5-6 without
significant stenosis or impingement.
3. Right-sided uncovertebral spurring at C3-4 with resultant mild
right C4 foraminal stenosis.
4. Normal MRI appearance of the cervical spinal cord. No evidence
for demyelinating disease.

## 2023-10-19 IMAGING — MR MR THORACIC SPINE W/O CM
5 of 6 series · 24 of 48 positions shown · non-contrast
Comparison: None available.

CLINICAL DATA: Initial evaluation for neck and mid back pain with
left lower extremity numbness. History of multiple sclerosis.

EXAM:
MRI THORACIC SPINE WITHOUT CONTRAST
TECHNIQUE: Multiplanar, multisequence MR imaging of the thoracic spine was
performed. No intravenous contrast was administered.

[Series 23: T1 · sagittal · 6.0mm · 1.37mm/px · 2 of 8 slices shown (1 of 2)]
[im 1/8]
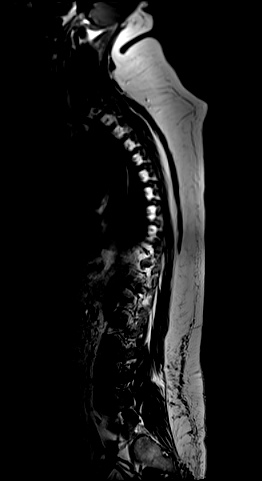
[im 8/8]
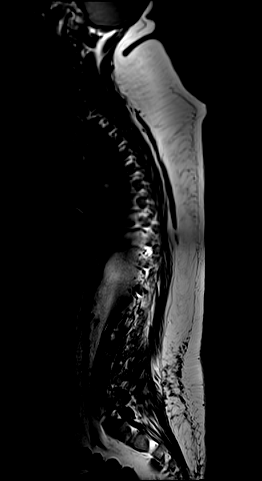

[Series 24: T2 · sagittal · 3.0mm · 0.82mm/px · 6 of 17 slices shown (1 of 2)]
[im 1/17]
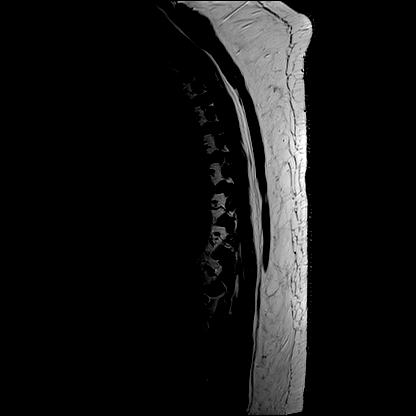
[im 4/17]
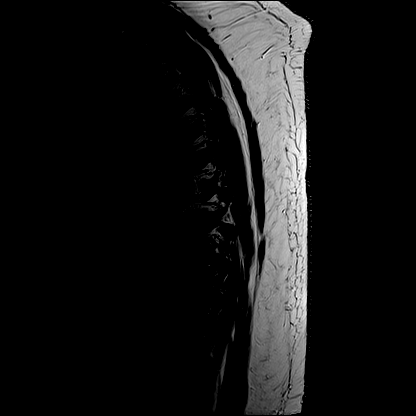
[im 7/17]
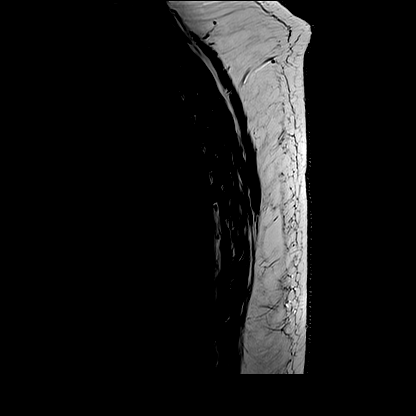
[im 10/17]
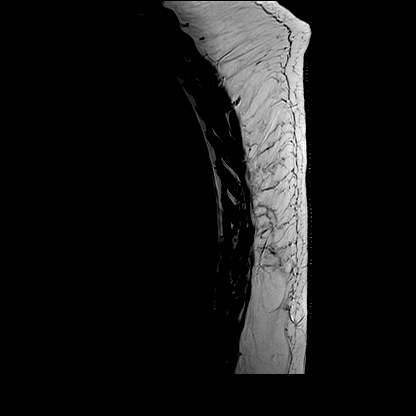
[im 13/17]
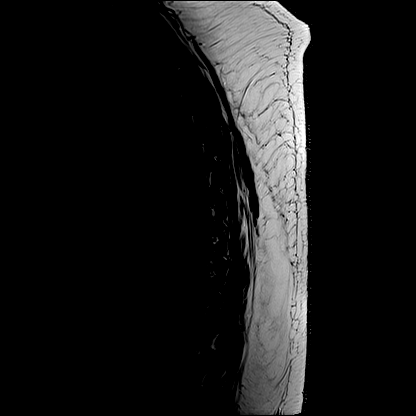
[im 17/17]
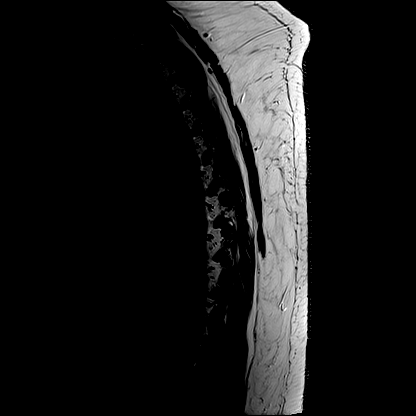

[Series 25: T1 · sagittal · 3.0mm · 0.79mm/px · 6 of 17 slices shown (2 of 2)]
[im 1/17]
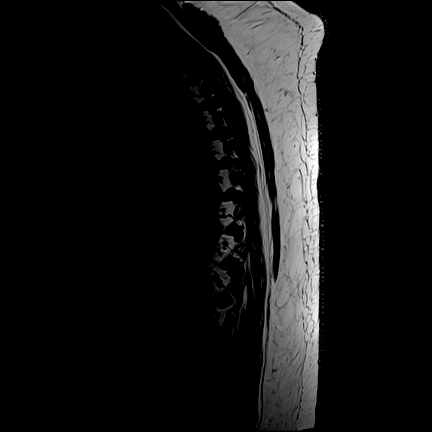
[im 4/17]
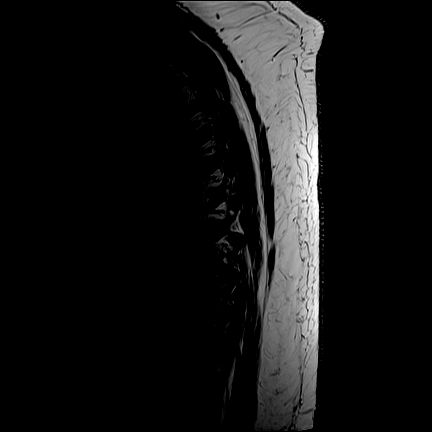
[im 7/17]
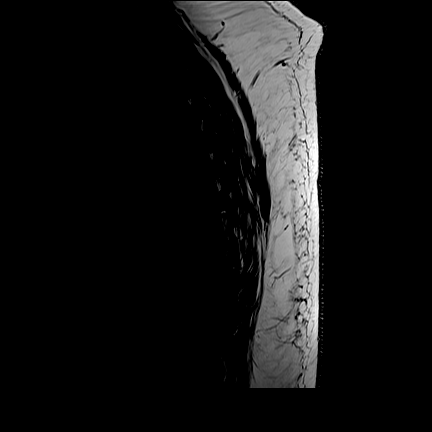
[im 10/17]
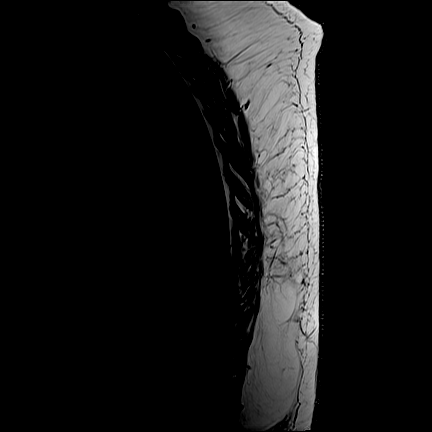
[im 13/17]
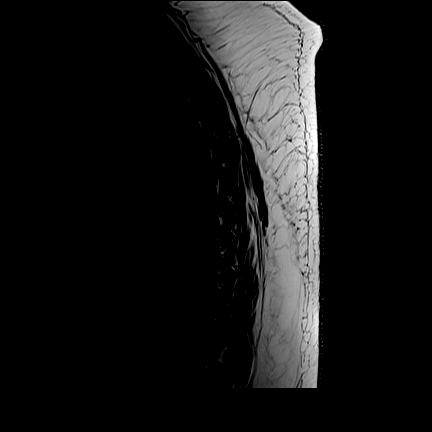
[im 17/17]
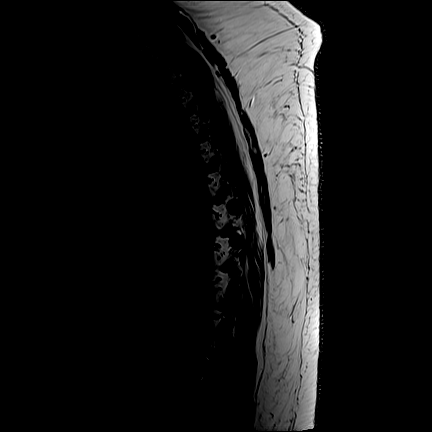

[Series 26: STIR · sagittal · 3.0mm · 0.41mm/px · 2 of 17 slices shown]
[im 1/17]
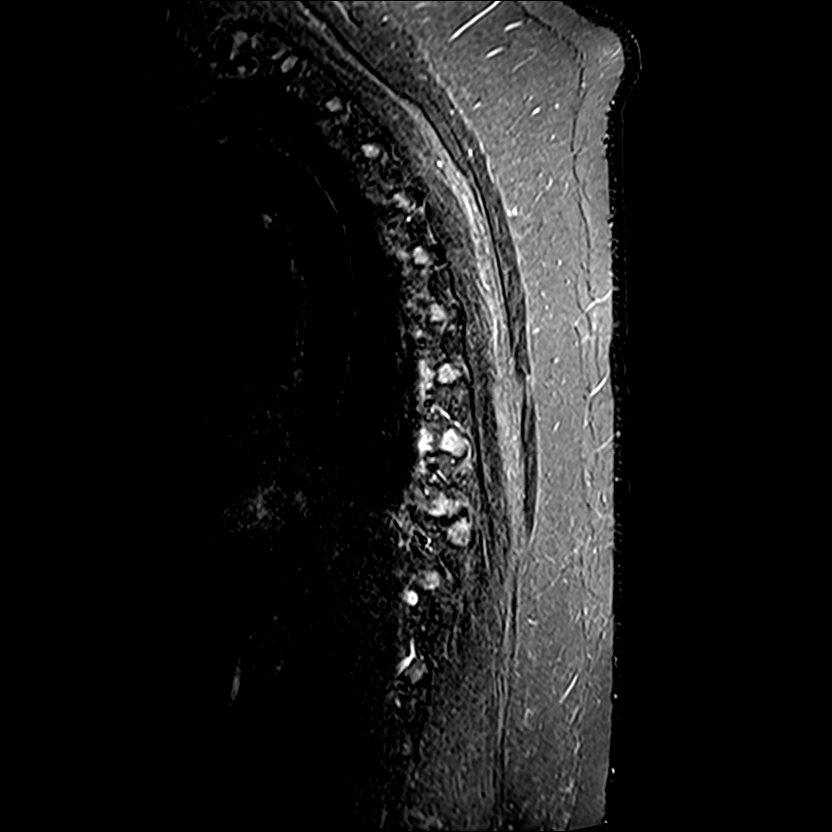
[im 4/17]
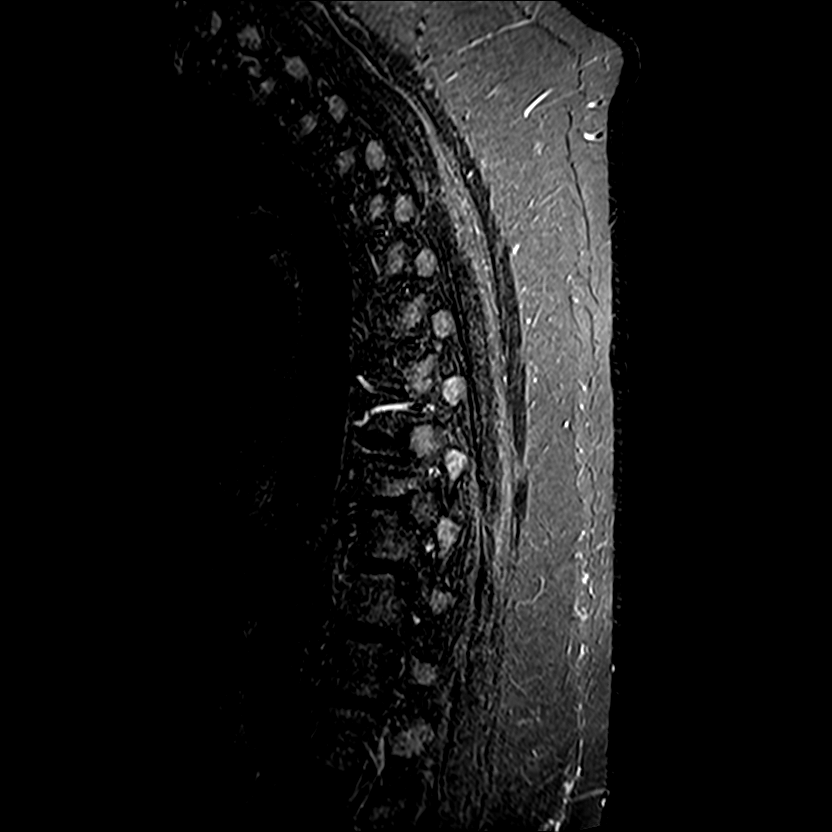

[Series 27: T2 · axial · 4.0mm · 0.59mm/px · z∈[-292,-69]mm · 8 of 39 slices shown (2 of 2)]
[im 1/39]
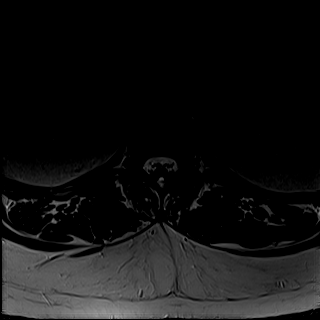
[im 6/39]
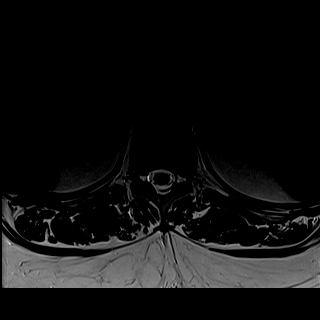
[im 12/39]
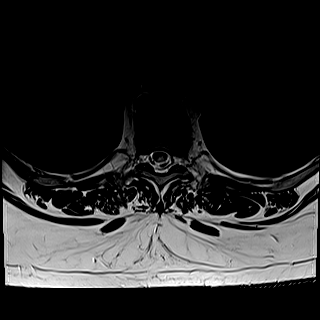
[im 18/39]
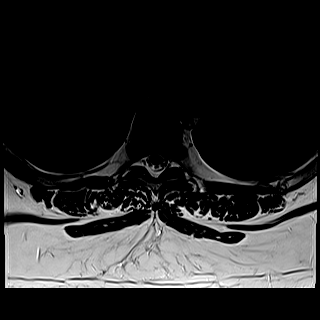
[im 21/39]
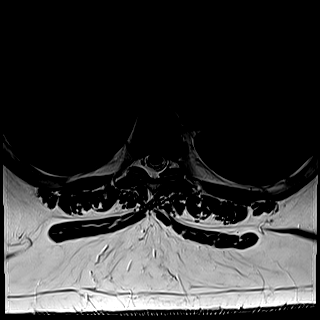
[im 27/39]
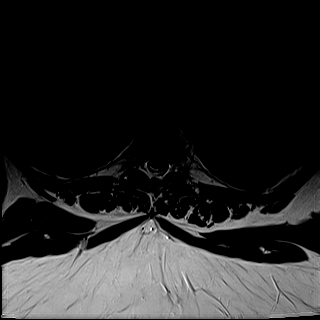
[im 33/39]
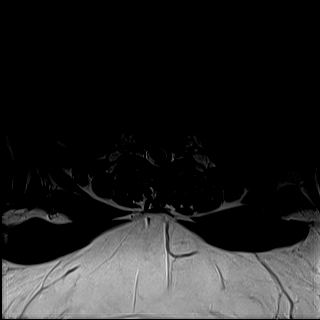
[im 39/39]
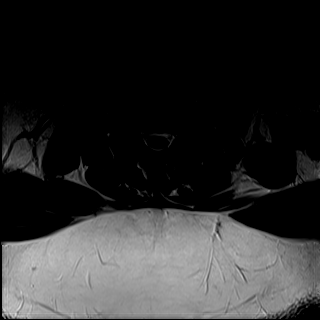

[24 of 48 positions shown; findings below may reference images not displayed]

FINDINGS: Alignment: Physiologic with preservation of the normal thoracic
kyphosis. No listhesis.

Vertebrae: Vertebral body height maintained without acute or chronic
fracture. Bone marrow signal intensity within normal limits.
Subcentimeter benign hemangioma noted within the T8 vertebral body.
No worrisome osseous lesions. No abnormal marrow edema.

Cord: Focal cord signal abnormality seen involving the left dorsal
cord at the level of T9 (series 26, image 9), consistent with known
history of demyelinating disease. Slight focal cord atrophy noted at
this level, consistent with a chronic demyelinating plaque.
Otherwise, appearance of the thoracic spinal cord is unremarkable
with normal signal and morphology.

Paraspinal and other soft tissues: Paraspinous soft tissues within
normal limits. Bulky appearance noted about the partially visualized
mediastinum and bilateral hila, raising the possibility for
underlying adenopathy (series 27, images 13, 17), incompletely
assessed on this exam. Remainder of the visualized visceral
structures otherwise unremarkable.

Disc levels:

T12-L1: Small central disc protrusion indents the ventral thecal sac
(series 27, image 38). No significant spinal stenosis or cord
deformity. Foramina remain patent.

Otherwise, no other significant disc pathology seen within the
thoracic spine. No other focal disc herniation. No significant canal
or foraminal stenosis or evidence for neural impingement.
IMPRESSION: 1. Focal cord signal abnormality involving the left dorsal cord at
the level of T9, consistent with known history of demyelinating
disease.
2. Small central disc protrusion at T12-L1 without stenosis or
neural impingement.
3. Otherwise unremarkable MRI appearance of the thoracic spine and
spinal cord.
4. Bulky appearance about the partially visualized mediastinum and
bilateral hila, raising the possibility for underlying adenopathy.
Further assessment with dedicated cross-sectional imaging of the
chest suggested for further evaluation.

## 2023-10-26 IMAGING — CT CT CHEST W/O CM
2 of 4 series · 15 of 36 positions shown, 18 images · non-contrast
Comparison: MRI thoracic spine 03/14/2023
COMPARISON: MRI thoracic spine [DATE]

CLINICAL DATA: Abnormal diagnostic imaging, adenopathy



[Series 2: routine chest without · axial · non-contrast · 0.75mm/px · z∈[-288,-58]mm · 12 of 137 slices shown, 15 images]
[im 11/137  mediastinal]
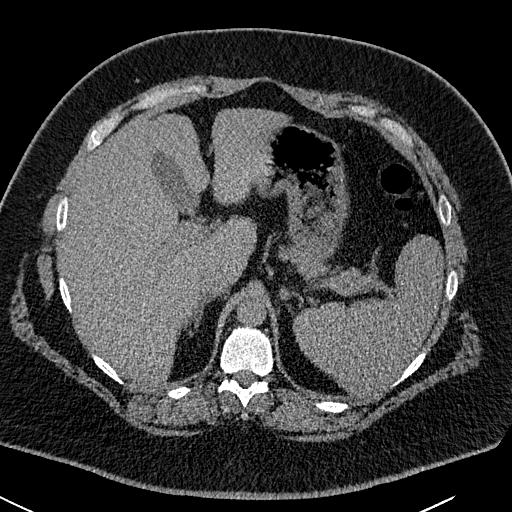
[im 11/137  lung]
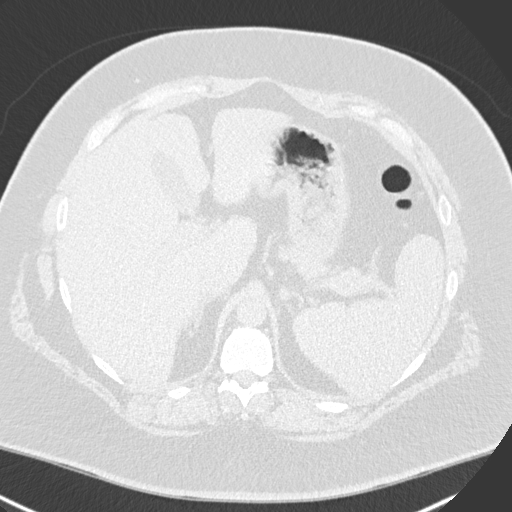
[im 21/137  lung]
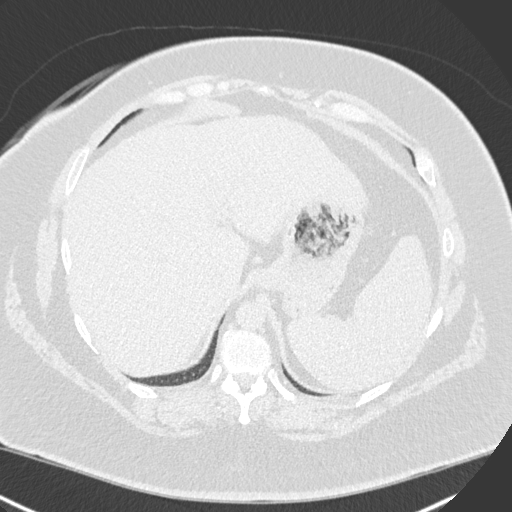
[im 32/137  lung]
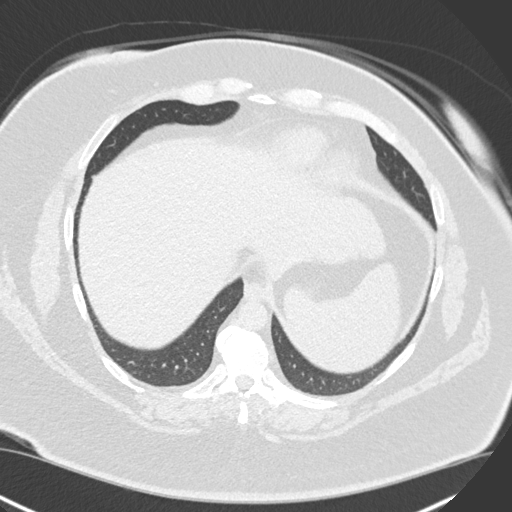
[im 42/137  lung]
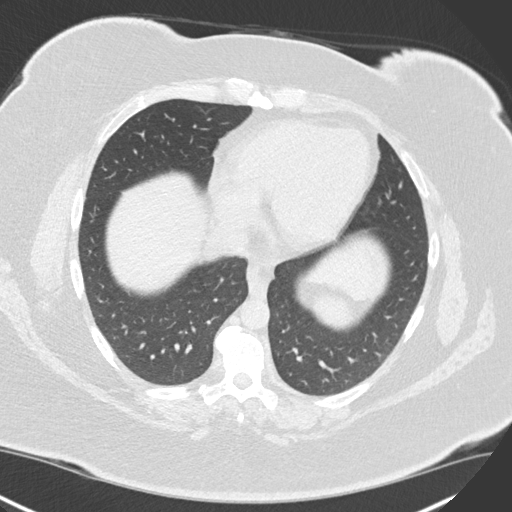
[im 53/137  mediastinal]
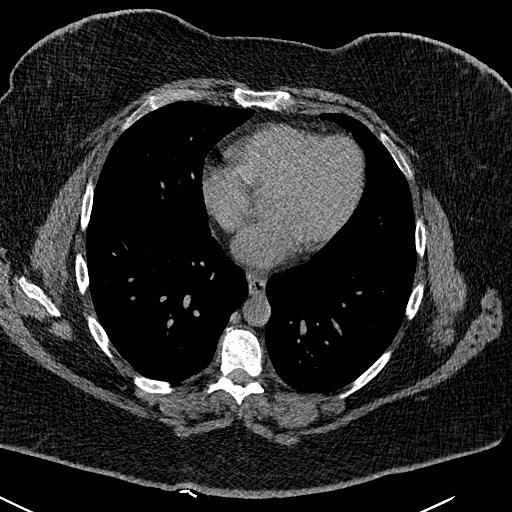
[im 53/137  lung]
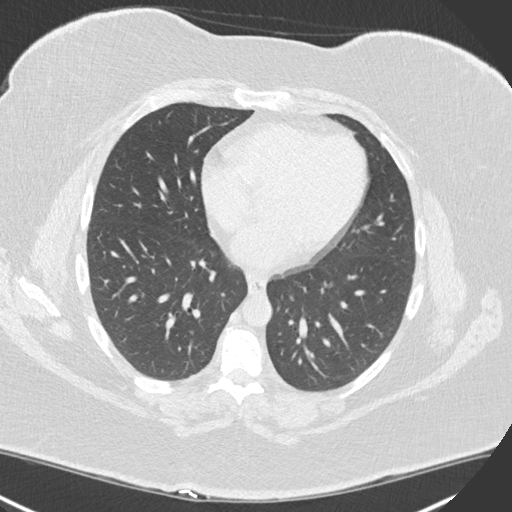
[im 63/137  lung]
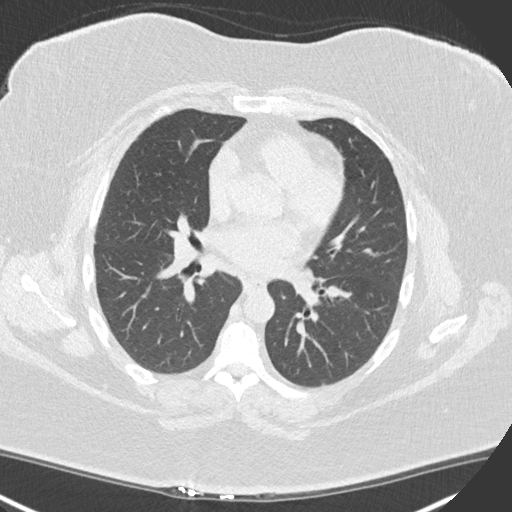
[im 74/137  lung]
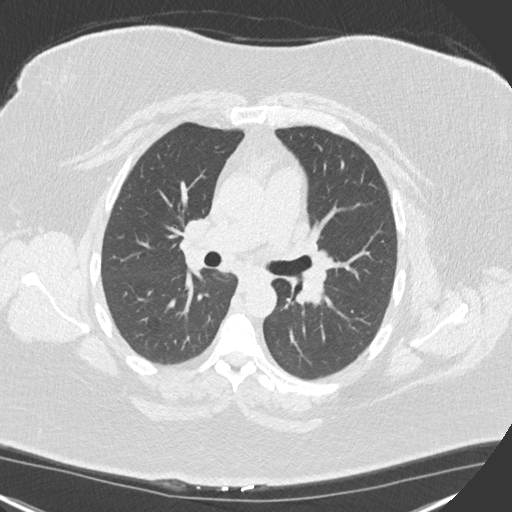
[im 84/137  lung]
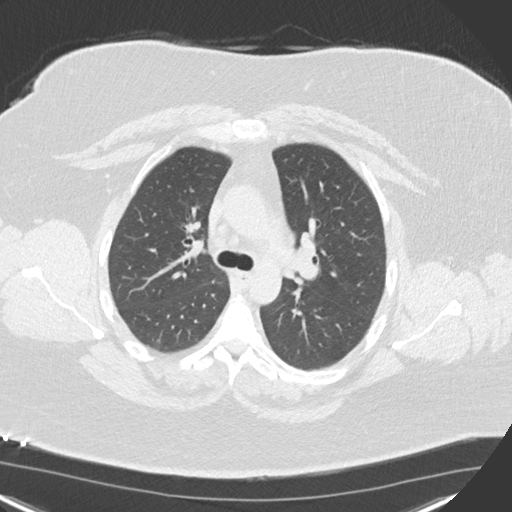
[im 95/137  mediastinal]
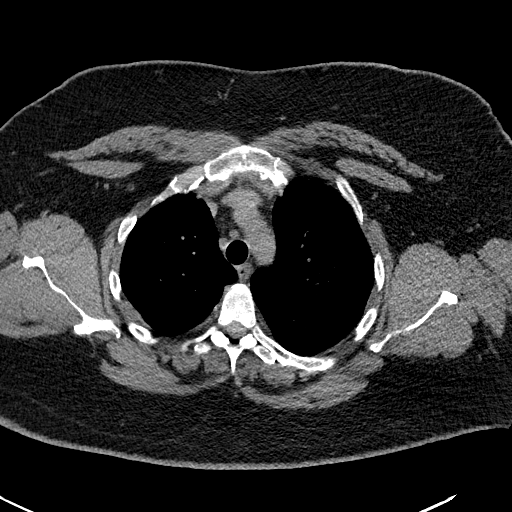
[im 95/137  lung]
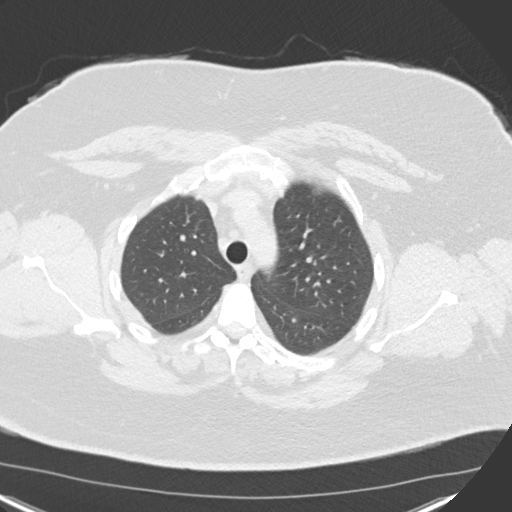
[im 105/137  lung]
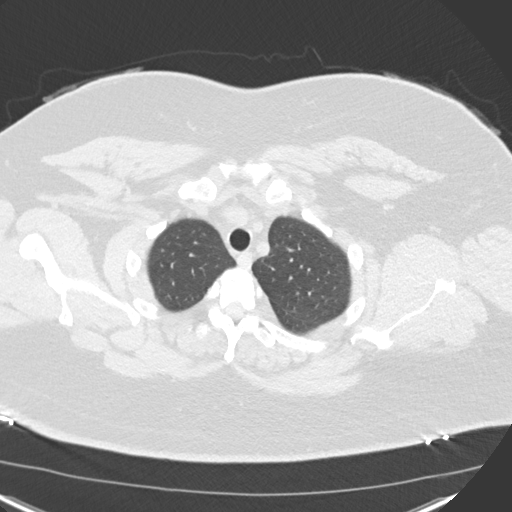
[im 116/137  lung]
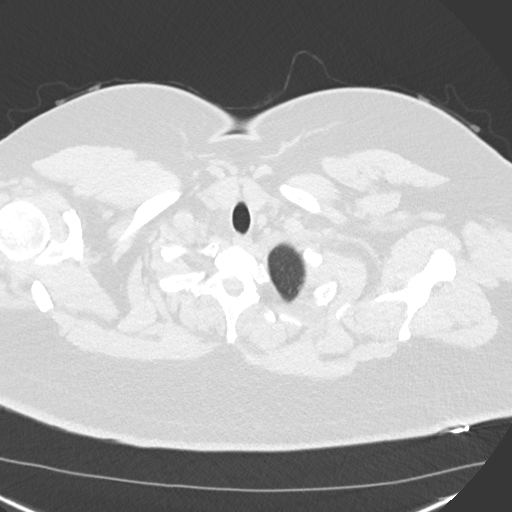
[im 126/137  lung]
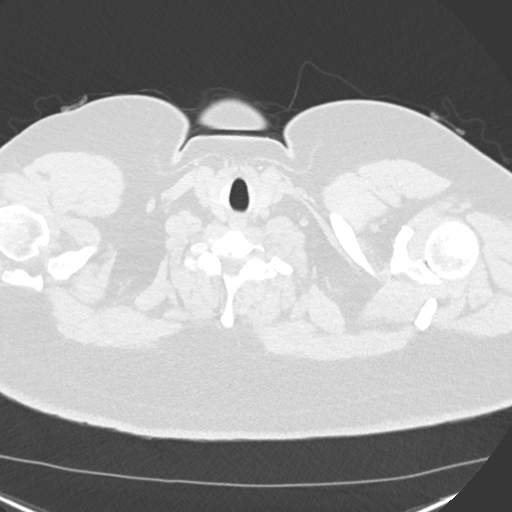

[Series 5: coronal · coronal · 0.59mm/px · 3 of 169 slices shown]
[im 34/169  lung]
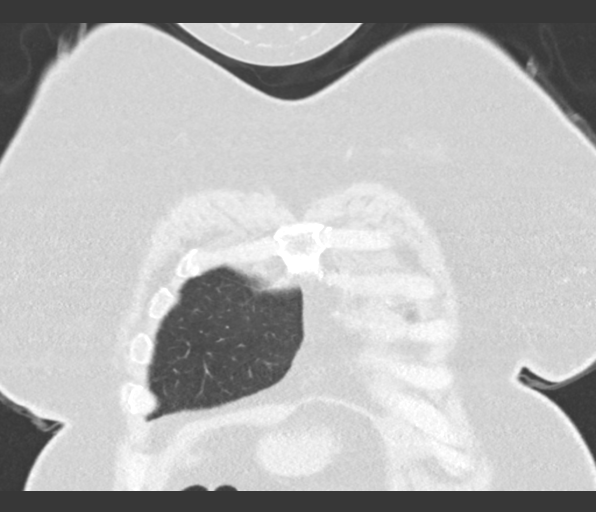
[im 68/169  lung]
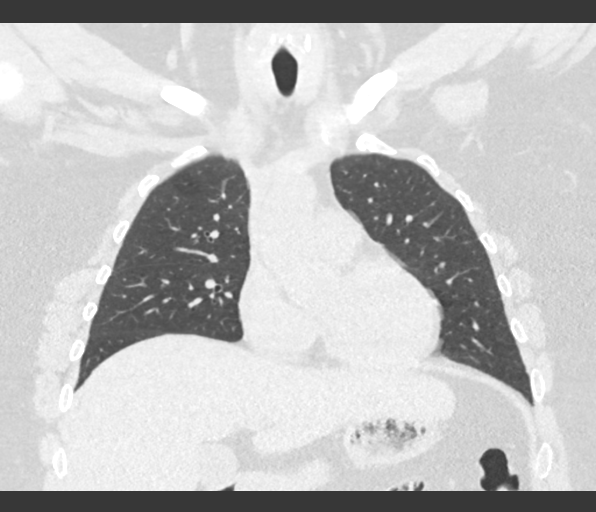
[im 101/169  lung]
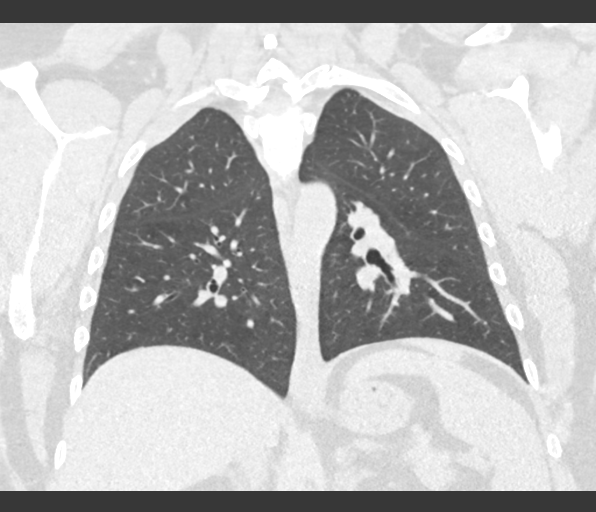

[15 of 36 positions shown; findings below may reference images not displayed]

FINDINGS: Cardiovascular: No significant vascular findings. Normal heart size.
No pericardial effusion.

Mediastinum/Nodes: No axillary or mediastinal lymphadenopathy
identified. Limited evaluation of the hila due to lack of IV
contrast, the hila do appear prominent right-greater-than-left,
which could represent enlarged lymph nodes.

Lungs/Pleura: Lungs are clear. No pleural effusion or pneumothorax.

Upper Abdomen: Spleen is enlarged measuring 13.2 cm in length.
Cholelithiasis.

Musculoskeletal: No suspicious bony lesions identified.
IMPRESSION: 1. Suspected nonspecific hilar adenopathy, limited evaluation due to
lack of IV contrast.
2. Mild splenomegaly.
3. Cholelithiasis.
FINDINGS: Cardiovascular: No significant vascular findings. Normal heart size.
No pericardial effusion.

Mediastinum/Nodes: No axillary or mediastinal lymphadenopathy
identified. Limited evaluation of the hila due to lack of IV
contrast, the hila do appear prominent right-greater-than-left,
which could represent enlarged lymph nodes.

Lungs/Pleura: Lungs are clear. No pleural effusion or pneumothorax.

Upper Abdomen: Spleen is enlarged measuring 13.2 cm in length.
Cholelithiasis.

Musculoskeletal: No suspicious bony lesions identified.
IMPRESSION: 1. Suspected nonspecific hilar adenopathy, limited evaluation due to
lack of IV contrast.
2. Mild splenomegaly.
3. Cholelithiasis.
